# Patient Record
Sex: Male | Born: 1950 | Race: Black or African American | Hispanic: No | Marital: Single | Smoking: Current every day smoker
Health system: Southern US, Community
[De-identification: ages and names within clinical notes are randomized; demographics above are authoritative.]

## PROBLEM LIST (undated history)

## (undated) DIAGNOSIS — I639 Cerebral infarction, unspecified: Secondary | ICD-10-CM

## (undated) DIAGNOSIS — I6529 Occlusion and stenosis of unspecified carotid artery: Secondary | ICD-10-CM

## (undated) DIAGNOSIS — N189 Chronic kidney disease, unspecified: Secondary | ICD-10-CM

## (undated) DIAGNOSIS — F209 Schizophrenia, unspecified: Secondary | ICD-10-CM

## (undated) DIAGNOSIS — I10 Essential (primary) hypertension: Secondary | ICD-10-CM

## (undated) HISTORY — DX: Cerebral infarction, unspecified: I63.9

## (undated) HISTORY — DX: Occlusion and stenosis of unspecified carotid artery: I65.29

## (undated) HISTORY — DX: Chronic kidney disease, unspecified: N18.9

---

## 2001-04-21 ENCOUNTER — Emergency Department (HOSPITAL_COMMUNITY): Admission: EM | Admit: 2001-04-21 | Discharge: 2001-04-21 | Payer: Self-pay | Admitting: Emergency Medicine

## 2001-04-22 ENCOUNTER — Ambulatory Visit (HOSPITAL_COMMUNITY): Admission: RE | Admit: 2001-04-22 | Discharge: 2001-04-22 | Payer: Self-pay | Admitting: Internal Medicine

## 2006-03-22 ENCOUNTER — Observation Stay (HOSPITAL_COMMUNITY): Admission: EM | Admit: 2006-03-22 | Discharge: 2006-03-24 | Payer: Self-pay | Admitting: Emergency Medicine

## 2006-04-04 ENCOUNTER — Emergency Department (HOSPITAL_COMMUNITY): Admission: EM | Admit: 2006-04-04 | Discharge: 2006-04-04 | Payer: Self-pay | Admitting: Emergency Medicine

## 2008-01-27 ENCOUNTER — Observation Stay (HOSPITAL_COMMUNITY): Admission: EM | Admit: 2008-01-27 | Discharge: 2008-01-30 | Payer: Self-pay | Admitting: Emergency Medicine

## 2008-05-17 DIAGNOSIS — K921 Melena: Secondary | ICD-10-CM

## 2008-05-17 DIAGNOSIS — F1010 Alcohol abuse, uncomplicated: Secondary | ICD-10-CM | POA: Insufficient documentation

## 2008-05-17 DIAGNOSIS — N390 Urinary tract infection, site not specified: Secondary | ICD-10-CM

## 2008-05-18 ENCOUNTER — Ambulatory Visit: Payer: Self-pay | Admitting: Internal Medicine

## 2008-06-08 ENCOUNTER — Ambulatory Visit: Payer: Self-pay | Admitting: Internal Medicine

## 2008-06-08 ENCOUNTER — Ambulatory Visit (HOSPITAL_COMMUNITY): Admission: RE | Admit: 2008-06-08 | Discharge: 2008-06-08 | Payer: Self-pay | Admitting: Internal Medicine

## 2010-08-15 NOTE — Op Note (Signed)
NAMEERIC, HEWSON                 ACCOUNT NO.:  [id number]   MEDICAL RECORD NO.:  [id number]          PATIENT TYPE:  AMB   LOCATION:  DAY                           FACILITY:  [facility]   PHYSICIAN:  R. Garfield Cornea, M.D. DATE OF BIRTH:  Aug 18, 1950   DATE OF PROCEDURE:  06/08/2008  DATE OF DISCHARGE:                               OPERATIVE REPORT   PROCEDURE PERFORMED:  Diagnostic colonoscopy.   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  He now has  constipation.  No first-degree relatives with colon cancer.  One second-  degree relative with colon cancer, has not had any blood per rectum.  No  abdominal pain.  No upper GI tract symptoms.  Colonoscopy is now being  done in part as screening but in part as a diagnostic maneuver given the  change in bowel habits.  He certainly does not appear to have any  symptoms consistent with proctitis this time.  Colonoscopy now being  performed.  Risks, benefits and limitations have been reviewed,  questions answered.  Please see documentation in the medical record.   PROCEDURE NOTE:  O2 saturation, blood pressure, pulse, respiration were  monitored the entire procedure.   CONSCIOUS SEDATION:  Versed 3 mg IV, Demerol 75 mg IV in divided doses.   INSTRUMENT USED:  Pentax video chip system.   FINDINGS:  Digital rectal exam revealed no abnormalities.   ENDOSCOPIC FINDINGS:  The prep was adequate.  Colon:  Colonic mucosa was surveyed from the rectosigmoid junction  through the left, transverse and right colon to the area of appendiceal  orifice, ileocecal valve and cecum.  These structures well seen and  photographed for the record.  From this level scope was slowly and  cautiously withdrawn.  All previous mentioned mucosal surfaces were  again seen.  The colonic mucosa appeared entirely normal except some for  some focal areas of somewhat hyperpigmentation of the  rectosigmoid area.  There was no erosion, ulceration, polyp, evidence of colitis or neoplasm  seen in the colonic mucosa.  Scope was pulled down to the rectum.  Thorough examination of the rectal mucosa including retroflex view of  the anal verge and en face view of the anal canal demonstrated a single  external hemorrhoidal tag.  The rectal mucosa otherwise appeared normal.  The patient tolerated the procedure well, was reacted in endoscopy.  The  cecal withdrawal time 9 minutes.   IMPRESSION:  Single external hemorrhoidal tag, otherwise normal rectum.  Some focally hyperpigmented mucosa in the rectosigmoid of uncertain  significance.  Remainder of colonic mucosa appeared entirely normal.   RECOMMENDATIONS:  1. MiraLax 17 grams orally at bedtime nightly p.r.n. constipation.  2. Literature on constipation provided to Mr. Dillow.  3. Recommend repeat screening colonoscopy 10 years.      Bridgette Habermann, M.D.  Electronically Signed     RMR/MEDQ  D:  06/08/2008  T:  06/08/2008  Job:  [id number]   cc:   Percell [name] L. Luan Pulling, M.D.  Fax: [phone]

## 2010-08-15 NOTE — Discharge Summary (Signed)
NAMESHEY, Eddie [name]                 ACCOUNT NO.:  [id number]   MEDICAL RECORD NO.:  [id number]          PATIENT TYPE:  OBV   LOCATION:  A304                          FACILITY:  [facility]   PHYSICIAN:  Eddie [name], M.D.DATE OF BIRTH:  [birth year]   DATE OF ADMISSION:  01/27/2008  DATE OF DISCHARGE:                               DISCHARGE SUMMARY   Being discharged from observation.   FINAL DISCHARGE DIAGNOSES:  1. Multiple falls versus syncope.  2. Vitamin B12 deficiency.  3. Hypertension.  4. Schizo-affective disorder.  5. Gastroesophageal reflux disease.   Eddie [name] has had a sense that his left leg gets hot.  This sensation  moves up the left side of his body, then he falls.  He has had this on  several occasions.  He has not had any actual seizure activity.  It is  not clear that these have been witnessed.   PAST MEDICAL HISTORY:  Positive for hypertension, for which he has been  taking a diuretic, and for schizo-affective disorder.   MEDICATIONS:  1. Cogentin 2 mg daily.  2. Omeprazole 20 mg daily.  3. Hypercholesterolemia 25 mg daily.  4. Injectable antipsychotic for his schizo-affective disorder.   PHYSICAL EXAMINATION:  Temp 98.5, pulse 71, respirations 18, blood  pressure 103/73 with no orthostatic changes.  CNS:  No focal findings.   White blood count was 8900, hemoglobin 17.1.  He had platelet clumps.  He had an elevated D-dimer but no evidence of pulmonary emboli.   CT showed no acute intracranial findings.   Point-of-care cardiac markers were negative.   HOSPITAL COURSE:  He had orthostatic changes checked.  They were  negative.  He had a neurology consultation and did not have any focal  neurological findings, but he did have vitamin B12 deficiency.  That is  going to be treated.   DISCHARGE MEDICATIONS:  He is going to be discharged home to continue  with his previous medication:  1. Cogentin 2 mg daily.  2. Omeprazole 20 mg daily.  3.  Hydrochlorothiazide 25 mg daily.  4. His injectable.  5. He is also going to be on injectable vitamin B12 first daily.  6. Folic acid 1 mg daily.   He will follow up in my office for the injections.      Eddie [name], M.D.  Electronically Signed     ELH/MEDQ  D:  01/30/2008  T:  01/30/2008  Job:  [id number]

## 2010-08-15 NOTE — H&P (Signed)
Eddie [name], [name]                 ACCOUNT NO.:  [id number]   MEDICAL RECORD NO.:  [id number]          PATIENT TYPE:  AMB   LOCATION:  DAY                           FACILITY:  [facility]   PHYSICIAN:  Eddie [name], M.D. DATE OF BIRTH:  [birth year]   DATE OF ADMISSION:  DATE OF DISCHARGE:  LH                              HISTORY & PHYSICAL   CHIEF COMPLAINT:  Coming for colonoscopy, constipation.   HISTORY OF PRESENT ILLNESS:  Eddie [name] is a 60 year old, African  American gentleman with history of endoscopic/biopsy-proven proctitis,  family history of colon cancer in maternal grandmother who was told he  need to have a followup colonoscopy in 7 years.  His last colonoscopy  was in 2003, and he had marked inflammation of the rectum consistent  with proctitis.  Biopsy showed focal cryptitis and acute on chronic  inflammation.  He was treated with a short course of [place] at the time.  He also had a EGD at that time, which had a small hiatal hernia but  otherwise, unremarkable.  The patient tells me that he has had no  further rectal bleeding.  He has had some new onset constipation.  He is  only having a bowel movement once a week.  He denies any abdominal pain,  nausea, or vomiting.  His heartburn is well controlled on omeprazole.  He denies any dysphagia, odynophagia, or weight loss.   CURRENT MEDICATIONS:  1. Benztropine 2 mg daily.  2. HCTZ 25 mg daily.  3. Folic acid 1 mg daily.  4. Omeprazole 20 mg daily.  5. He receives some sort of shot at [facility] every 2 weeks for      schizophrenia.   ALLERGIES:  No known drug allergies.   PAST MEDICAL HISTORY:  1. Schizophrenia.  2. History of proctitis as above.   PAST SURGICAL HISTORY:  History of colonoscopy as above.   FAMILY HISTORY:  Mother has diabetes, glaucoma, and hypothyroidism.  Father deceased at age 36, unknown cause.  He has a brother who died  after a car fell on him at age 69.  He has multiple sisters with  diabetes.  Maternal grandmother reported to have colon cancer, age  unknown.   SOCIAL HISTORY:  He lives with his mother.  He has no children.  He is  single.  He is disabled.  He smokes a pack of cigarettes daily.  He  denies any alcohol use.   REVIEW OF SYSTEMS:  GI:  See HPI.  CONSTITUTIONAL:  See HPI.  CARDIOPULMONARY:  He denies chest pain, shortness of breath,  palpitations, or cough.  GENITOURINARY:  Denies dysuria, hematuria.   PHYSICAL EXAMINATION:  VITAL SIGNS:  Weight 201, height 6 feet, temp 98,  blood pressure 100/76, and pulse 64.  GENERAL:  Pleasant, well-nourished, well-developed, white gentleman in  no acute distress.  SKIN:  Warm and dry.  No jaundice.  HEENT:  Sclerae nonicteric.  Oropharyngeal mucosa moist and pink.  No  lesions, erythema, or exudate.  No lymphadenopathy or thyromegaly.  CHEST:  Lungs are clear to auscultation.  CARDIOVASCULAR:  Regular rate and rhythm.  No murmurs, rubs, or gallops.  ABDOMEN:  Positive bowel sounds.  Abdomen is soft, nontender, and  nondistended.  No organomegaly or masses.  No rebound or guarding.  No  abdominal bruits or hernias.  LOWER EXTREMITIES:  No edema.   IMPRESSION:  Eddie [name] is a 60 year old gentleman with history of  proctitis as outlined above.  He now has new onset constipation.  Family  history of colon cancer in a second-degree relative.  He was advised to  have surveillance colonoscopy in 7 years due to his family history, but  based on new guidelines, he can probably have a screening colonoscopy in  10 years.  However, he does have change in bowel movements with new-  onset constipation of several months' duration and would have asked  colonoscopy for diagnostic approach at this time.   PLAN:  1. Colonoscopy in the near future with Dr. Gala [name].  2. Trial of MiraLax 17 g daily, prescription for generic with 527 g, 5      refills provided.  3. Further recommendations to follow.      Eddie [name],  P.ABridgette [name], M.D.  Electronically Signed    LL/MEDQ  D:  05/18/2008  T:  05/18/2008  Job:  [id number]   cc:   Eddie [name] Eddie [name], M.D.  Fax: [phone]

## 2010-08-15 NOTE — Group Therapy Note (Signed)
NAMETIMBERLAND, ZEHRUNG                 ACCOUNT NO.:  [id number]   MEDICAL RECORD NO.:  [id number]          PATIENT TYPE:  OBV   LOCATION:  A304                          FACILITY:  [facility]   PHYSICIAN:  Edward L. Luan Pulling, M.D.DATE OF BIRTH:  06-Oct-1950                                 PROGRESS NOTE   SUBJECTIVE:  Mr. Eddie [name] has done well.  He is not having as much orthostatic change.  He says he does not feel like he is going to fall as much.  He is using  his TED hose and they seemed to have helped.  Dr. Freddie Apley help is  noted and appreciated.   OBJECTIVE:  GENERAL:  His exam this morning shows he is awake and alert.  He looks  comfortable.  CHEST:  Clear.  HEART:  Regular.   LABORATORY DATA:  His cortisol levels are normal.  TSH is normal.  Homocysteine is high.  Vitamin B12 is low so we may need to get him on some vitamin B12  injections and I am going to go ahead and start that today.   ASSESSMENT AND PLAN:  Otherwise, I think thinks are going well.  He can be discharged today.  Please see discharge summary for details.      Edward L. Luan Pulling, M.D.  Electronically Signed     ELH/MEDQ  D:  01/30/2008  T:  01/30/2008  Job:  [id number]

## 2010-08-15 NOTE — Group Therapy Note (Signed)
Eddie [name], Eddie [name]                 ACCOUNT NO.:  [id number]   MEDICAL RECORD NO.:  [id number]          PATIENT TYPE:  OBV   LOCATION:  A304                          FACILITY:  [facility]   PHYSICIAN:  Kofi A. Merlene Laughter, M.D. DATE OF BIRTH:  10-13-1950   DATE OF PROCEDURE:  01/29/2008  DATE OF DISCHARGE:                                 PROGRESS NOTE   Patient does not report having any dizzy spells or syncopal episode  today although in reviewing the orthostatics it appears that the nurse  recorded dizziness with one of the orthostatic measurements.  The first  one is laying down blood pressure 94/64, pulse 76.  Standing, 94/64,  pulse 89.  Second one laying down, blood pressure 115/69, pulse 72 and  standing 103/68 with pulse 84.  It is during the 2nd parameters where  the patient complained of dizziness and he did have some drop in the  systolic blood pressure.  Patient is currently awake and alert and  converses well.  He continued to have a mild dysarthria, moves all 4  extremities. Blood tests, vitamin B12 level of 173 which is low,  cortisol level 21, normal, TSH 1.39.  EEG pending.   ASSESSMENT:  1. Recurrent spells of syncope, unclear etiology.  EEG is pending.  We      will follow up that.  2. Vitamin B12 deficiency.  We will replace it with 1000 mcg while      hospitalized and switch to 1000 mcg weekly once discharged from the      hospital.  We will also add folic acid 1 mg daily.      Kofi A. Merlene Laughter, M.D.  Electronically Signed     KAD/MEDQ  D:  01/30/2008  T:  01/30/2008  Job:  [id number]

## 2010-08-15 NOTE — Consult Note (Signed)
Eddie [name], VOLKOV                 ACCOUNT NO.:  [id number]   MEDICAL RECORD NO.:  [id number]          PATIENT TYPE:  OBV   LOCATION:  A304                          FACILITY:  [facility]   PHYSICIAN:  Kofi A. Merlene Laughter, M.D. DATE OF BIRTH:  [birth year]   DATE OF CONSULTATION:  01/28/2008  DATE OF DISCHARGE:                                 CONSULTATION   REASON FOR CONSULTATION:  Fall versus syncope.   The patient is a 60 year old black male who apparently has had a couple  spells recently of passing out.  He has maybe had a few of these events  but they are a recent onset.  The patient reports that he gets a hot  sensation involving the left knee and left leg and then spreads to his  head and he simply blacks out.  Patient indicated he blacks out for  about 10 minutes.  There is no oral trauma.  No urinary continence.  No  focal neurological deficit.  He does report having paresthesias of the  hands bilaterally.  No headaches are reported.  No visual symptoms.   PAST MEDICAL HISTORY:  Significant for:  1. Schizoaffective disorder.  2. Hypertension.   ADMISSION MEDICATION:  1. Cogentin 2 mg daily.  2. Omeprazole 20 mg daily.  3. Hydrochlorothiazide 25 mg daily.   REVIEW OF SYSTEMS:  Essentially negative other than stated in history of  present illness.  It is also essentially unchanged from admission review  of systems   SOCIAL HISTORY:  Patient does smoke cigarettes.  No alcohol use.  No  illicit drug use.   PHYSICAL EXAMINATION:  Shows a mildly overweight pleasant man in no  acute distress.  Temperature 98.7.  pulse 71.  Respiration 18.  Blood  pressure 103/73.  HEENT EVALUATION:  Neck is supple.  Head is normocephalic, atraumatic.  ABDOMEN:  Soft.  EXTREMITIES:  No edema.  MENTATION:  The patient is awake and alert.  He converses fairly well.  He currently has reduced academic achievement at baseline.  He does seem  to have mild dysarthria.  I suspect this is probably baseline  for the  patient.  CRANIAL NERVE EVALUATION:  Pupils are 6 mL and reactive although  somewhat sluggish.  Extraocular movements are full.  Visual fields are  intact.  Facial muscles run symmetric.  Tongue is midline.  Uvula  midline.  Shoulder shrugs normal.  MOTOR EXAMINATION:  Shows normal tone, bulk, and strength throughout.  I  see no pronator drift.  Reflexes are preserved.  Coordination shows no  dysmetria.  No tremor.  No parkinsonism.  Sensation is symmetric to  light touch and pinprick.  Gait is slightly wobbly.   CT scan of the brain is reviewed and shows minimal white matter changes  but essentially negative CT scan.  WBC 8, hemoglobin is 70, platelet  count apparently the smear is clogged but the count appears to be  adequate.  Sodium 132, potassium 3.9, chloride 95, CO2 of  29, glucose  88, BUN 8, creatinine 1.0.  Liver enzymes normal.  Calcium is 9.5.  Urinalysis is negative.   ASSESSMENT:  Recurrent spells of what appears to be syncope of unclear  etiology.  Semiology does not seem to fit any known clinical syndromes  at this time.  Certainly, atypical seizure may be a possibility.  The  patient reported that he has had a cardiac evaluation in the past for  these events.  One wonders about medication effect possibly from the  Cogentin.  Certainly, orthostasis is a possibility.   RECOMMENDATION:  Agree with orthostatic parameters.  I am going to go  ahead and order an EEG.  We may want to consider reducing the dose of  the Cogentin to 1 mg.      Kofi A. Merlene Laughter, M.D.  Electronically Signed     KAD/MEDQ  D:  01/29/2008  T:  01/29/2008  Job:  [id number]

## 2010-08-15 NOTE — H&P (Signed)
[name], Eddie [name]                 ACCOUNT NO.:  [id number]   MEDICAL RECORD NO.:  [id number]          PATIENT TYPE:  OBV   LOCATION:  A304                          FACILITY:  [facility]   PHYSICIAN:  Edward L. Luan Pulling, M.D.DATE OF BIRTH:  24-Oct-1950   DATE OF ADMISSION:  01/27/2008  DATE OF DISCHARGE:  LH                              HISTORY & PHYSICAL   The patient admitted to observation for unwitnessed syncopal episode.   HISTORY:  Mr. Eddie [name] is a 60 year old who has had a 2-3 day history of  what he describes as passing out.  He says that his left leg gets hot,  it moves up the left side of his body, and then he falls.  He has not  had any seizures activity that has been noted.  It is not totally clear  to me that any of these have actually been witnessed.  He does not have  any headaches.  He does not have any fever.  He does not have any  chills.  He does not have any other new complaints.   PAST MEDICAL HISTORY:  Positive for:  1. Hypertension.  2. Schizoaffective disorder.   SOCIAL HISTORY:  He does not use alcohol.  Does not any illicit drugs.  He does smoke about a packet of cigarettes daily.   ALLERGIES:  He has had previous reactions to THORAZINE.   CURRENT MEDICATIONS:  As best I can tell are:  1. Cogentin 2 mg daily.  2. Omeprazole 20 mg daily.  3. Benztropine.  4. Hydrochlorothiazide 25 mg daily.  5. He takes what I think is some sort of an injection for his      schizoaffective disorder.   PHYSICAL EXAMINATION:  VITAL SIGNS:  His temperature is 98.5, pulse 71,  respirations 18, and blood pressure 103/73.  HEENT:  His pupils are reactive.  Nose and throat are clear.  NECK:  Supple without masses.  CHEST:  Clear.  HEART:  Regular.  No murmur, gallop, or rub.  ABDOMEN:  Soft.  EXTREMITIES:  Showed no edema.  CNS:  He has clearly got some of the problems, and he is on  antipsychotic medications, but he has no focal findings.  NEUROLOGIC:  He does not have any  focal neurological findings.  He has  good strength bilaterally.   LABORATORY DATA:  White count 8900, hemoglobin 17.1, and platelets show  clumps, but it does appear that the count is adequate.  D-dimer was  0.49.  Sodium 132.  CT angio of the chest, no evidence of pulmonary  embolus.  No acute findings in the chest.  CT of the brain, no acute  intracranial findings.  Chest x-ray showed no active disease.  Point-of-  care cardiac markers were negative.   ASSESSMENT:  He is having episodes of falling.  It is not at all clear  what this is from.  I am going to plan to get a Neurology consultation.  I am going to check orthostatic blood pressures, etc., and follow.      Edward L. Luan Pulling, M.D.  Electronically Signed  ELH/MEDQ  D:  01/28/2008  T:  01/28/2008  Job:  [id number]

## 2010-08-15 NOTE — Group Therapy Note (Signed)
NAMESUNSHINE, ZEMBA                 ACCOUNT NO.:  [id number]   MEDICAL RECORD NO.:  [id number]          PATIENT TYPE:  OBV   LOCATION:  A304                          FACILITY:  [facility]   PHYSICIAN:  Edward L. Luan Pulling, M.D.DATE OF BIRTH:  1950-06-24   DATE OF PROCEDURE:  DATE OF DISCHARGE:                                 PROGRESS NOTE   HISTORY:  Mr. Brissey seems better.  He has no new complaints.  He states  he still feels like he might fall.  His orthostatic changes are not  terribly significant, but he does seem to be a bit off balance when he  stands.  Dr. Merlene Laughter has seen him and his help is noted and  appreciated.   PHYSICAL EXAMINATION:  VITAL SIGNS:  Temperature is 98.5, pulse 67,  respirations 20, and blood pressure 120/74.  GENERAL:  He is awake and alert.  CHEST:  Pretty clear.  EXTREMITIES:  He does have some orthostatic symptoms at least.   ASSESSMENT:  He had some orthostasis.  Dr. Merlene Laughter has seen him and  suggested we do an EEG, which I think is appropriate.  I am going to try  to get some TED hose on him, but I am not sure there is going to be much  else we can do.  I am going to await and see what the results of the TED  hose are and then see if we can perhaps get him discharged as expected  in the morning.      Edward L. Luan Pulling, M.D.  Electronically Signed     ELH/MEDQ  D:  01/29/2008  T:  01/30/2008  Job:  [id number]

## 2010-08-18 NOTE — Op Note (Signed)
[facility]  Patient:    Eddie [name], Eddie [name] Visit Number: [id number] MRN: [id number]          Service Type: END Location: DAY Attending Physician:  Bridgette Habermann Dictated by:   Garfield Cornea, M.D. Proc. Date: 04/22/01 Admit Date:  04/22/2001 Discharge Date: 04/22/2001   CC:         Walden Field, M.D., ED, [facility]  Sinda Du, M.D.   Operative Report  PROCEDURE: 1. Diagnostic esophagogastroduodenoscopy. 2. Colonoscopy. 3. Biopsy. 4. Stool sampling.  GASTROENTEROLOGIST:  Garfield Cornea, M.D.  INDICATIONS:  The patient is a 60 year old gentleman who presented to the emergency department last evening with hematochezia.  He as seen by Dr. Ivy Lynn.  He was found to be hemodynamically stable with hemoglobin in the 16 range.  I was called to evaluate him.  He comes now for both EGD and colonoscopy.  He has not been using any NSAIDs, no melena.  He does not have any GI symptoms aside from chronic constipation and recent gross blood per rectum.  He tells me his grandmother had colon cancer.  EGD and colonoscopy are now being done to further evaluate his symptoms. This approach has been discussed with the patient at the bedside at length. Potential risks, benefits, and alternatives have been reviewed and questions answered.  The patient is agreeable.  Please see my handwritten H&P for more information.  PROCEDURE NOTE:  O2 saturation, blood pressure, and pulse for this patient were monitored throughout the entirety of both procedures.  CONSCIOUS SEDATION FOR BOTH PROCEDURES:   Demerol 75 mg IV,  Versed 3 mg IV in divided doses.  INSTRUMENT:  Olympus video chip gastroscope and colonoscope.  EGD FINDINGS:   Examination of the tubular esophagus revealed no mucosal abnormalities.   EG junction was easily traversed.  STOMACH:  Gastric cavity was emptied and insufflated well with air.  Thorough examination of gastric mucosa including retroflexed  view of the proximal stomach and esophagogastric junction demonstrated no abnormalities.  Pylorus was patent and easily traversed.  DUODENUM: Bulb and second portion appeared normal.  THERAPEUTIC/DIAGNOSTIC MANEUVERS PERFORMED:  None.  The patient was then prepared for colonoscopy.  COLONOSCOPY FINDINGS:  Digital rectal examination revealed no abnormalities.  ENDOSCOPIC FINDINGS:  Prep was good.  Rectum:  Examination of rectal mucosa including retroflexed view of the anal verge revealed grossly abnormal rectal mucosa with granularity, friability, erosions, loss of the normal vascular pattern in a patchy distribution from the anal verge to the rectosigmoid at 25 cm.  Please see photos.  There was little normal-appearing mucosa in patches in this inflamed rectum.  Retroflexion demonstrated no other abnormalities.  COLON:  Colonic mucosa was surveyed from the rectosigmoid junction through the left transverse right colon to the area of the appendiceal orifice, ileocecal valve, and cecum.  These structures were well seen and photographed.   Colonic mucosa appeared entirely normal to the cecum.  From the level of the cecum and ileocecal valve, the scope was slowly withdrawn.  All previously mentioned mucosal surfaces were again seen.  No other abnormalities were observed. Stool residue was aspirated for studies.  Also, biopsies of the rectum were taken for histology.  The patient tolerated both procedures well and was reacted in endoscopy.  IMPRESSION: 1. Esophagogastroduodenoscopy: Small hiatal hernia, otherwise normal upper    gastrointestinal tract. 2. Colonoscopy: Markedly inflamed rectum consistent with proctitis, etiology    uncertain at this time. 3. Normal-appearing colonic mucosa. 4. Stool studies pending. 5. Biopsy of  the rectal mucosa taken.  RECOMMENDATIONS: 1. The patient is chronically constipated.  I have asked him to start on    some Metamucil or Citrucel daily. 2.  I have given him a 10-day course of Anusol AC suppositories 1 p. r.    at bedtime. 3. Repeat CBC and sed rate today.  Follow up on stool studies and biopsies. 4. Further recommendations to follow. Dictated by:   Garfield Cornea, M.D. Attending Physician:  Bridgette Habermann DD:  04/22/01 TD:  04/23/01 Job: [id number]

## 2010-08-18 NOTE — Discharge Summary (Signed)
Eddie [name], Eddie [name]                 ACCOUNT NO.:  [id number]   MEDICAL RECORD NO.:  [id number]          PATIENT TYPE:  INP   LOCATION:  A220                          FACILITY:  [facility]   PHYSICIAN:  Audria Nine, M.D.DATE OF BIRTH:  27-Aug-1950   DATE OF ADMISSION:  03/22/2006  DATE OF DISCHARGE:  12/23/2007LH                               DISCHARGE SUMMARY   PRIMARY CARE PHYSICIAN:  Unassigned.   DISCHARGE DIAGNOSES:  1. Atypical chest pain.  2. Poorly controlled to uncontrolled hypertension.  3. History of psychiatric illness, likely schizoaffective disorder.   DISCHARGE MEDICATIONS:  1. Hydrochlorothiazide 25 p.o. once a day.  2. The patient is to resume all of his prior home medications as      previously taking.   FOLLOW UP:  The patient is to follow up with his primary care physician  as needed.   SPECIAL INSTRUCTIONS:  The patient was advised to return to the  emergency room if chest pain recurs.   The patient was informed that he has hypertension which we had some  trouble controlling during the course of hospitalization.  The patient  was advised to follow this up closely.   HOSPITAL COURSE:  Mr. Heinzman is a 60 year old African-American male with  a history of psychiatric illness.  The patient was noted to be a  generally poor historian on admission.  The patient was brought to the  emergency room by his mother.  The patient developed retrosternal chest  pain and epigastric pain 3 days prior to admission.  This developed  after eating a lot of ham.  The pain radiated to the jaw, left hand,  abdomen, and upper chest wall at 8/10 at its worst. There was concern  that this may represent a cardiac pain.  The patient was subsequently  admitted to a telemetry bed.  EKG was normal at the time of admission.  Cardiac enzymes returned negative.   There is premature coronary artery disease in his grandmother.  During  the course of hospitalization, the patient improved  dramatically without  any recurrence of his chest pain.   He did fairly well without any concerns.  The only abnormality found  during hospitalization was blood pressure elevation.  His blood pressure  ranged between 141-152/77-93.  It was recommended to start him on  hydrochlorothiazide as mentioned above.   Smoking cessation was also discussed in detail with him.   PERTINENT LABORATORY DATA:  On admission, chest x-ray showed no  pulmonary abnormalities, cardiomegaly, with minimal bronchitic changes.   A 12-lead EKG showed normal sinus rhythm with no acute ST-T changes.   White blood cell count 6.8, hemoglobin 15.3, hematocrit 46.1, platelet  count 271.  D-dimer was negative at 0.26.  Basic metabolic profile was  normal.  Cardiac enzymes were negative.  Cholesterol 167, triglycerides  206, HDL 21, LDL 105.  TSH was normal at 0.892. Urinalysis was negative.      Audria Nine, M.D.  Electronically Signed     AM/MEDQ  D:  03/26/2006  T:  03/26/2006  Job:  [id number]

## 2010-08-18 NOTE — H&P (Signed)
Eddie [name], Eddie [name]                 ACCOUNT NO.:  [id number]   MEDICAL RECORD NO.:  [id number]          PATIENT TYPE:  INP   LOCATION:  A220                          FACILITY:  [facility]   PHYSICIAN:  Eddie [name], Eddie MillerDATE OF BIRTH:  05/26/1950   DATE OF ADMISSION:  03/22/2006  DATE OF DISCHARGE:  12/23/2007LH                              HISTORY & PHYSICAL   PRIMARY CARE PHYSICIAN:  Previously Dr. Luan Pulling, but the patient is  unassigned, has not seen Dr. Luan Pulling in 2 years.   CHIEF COMPLAINT:  Chest pain.   HISTORY OF PRESENTING COMPLAINT:  Eddie [name] is a 60 year old African  American male with a history of psychiatric ailment.  He gets injection  every 2 weeks.  He is a poor historian.  However, he was accompanied to  the emergency room by his mother.  Mother stated the patient developed  epigastric/retrosternal chest pain about 3 days ago, after eating a lot  of ham.  According to the patient, this pain was radiating all over to  his jaw, left hand, abdomen, upper chest.  His history does not sound  reliable.  He denies associated shortness of breath or diaphoresis.  He  reported that this pain was 8/10 at its worst.  He described the pain as  burning.  It is currently 0/10.  The pain had resolved prior to  presentation at the emergency room.  There is no associated diarrhea,  vomiting, or nausea.   The patient had an EKG done in the emergency room which was a normal  sinus rhythm without acute ischemic changes.  His first set of cardiac  markers was negative.  The patient is a smoker and he smokes 2 packs of  cigarettes per day.  He has a history of premature coronary artery  disease in his maternal grandmother.  And given the fact that his  history is somewhat unreliable, he will be admitted for 24-hour  observation to rule out and further evaluate.   PAST MEDICAL HISTORY:  1. Psychiatric illness for which he gets injection every two weeks,      precise psychiatric  ailment is unclear.  2. Previous hospitalization at [facility] x2.   CURRENT MEDICATIONS:  1. Weekly injection, name unknown.  He obtains this from mental health      clinic.  2. Cogentin dose unknown.   FAMILY HISTORY:  Both parents are alive.  His mother has diabetes.  His  father's health history is unknown.  The patient's maternal grandmother  had MI at the age of 52.  His maternal family has a strong history of  coronary artery disease.  His paternal family has a strong history of  cancer.  Further details could not be obtained.   SOCIAL HISTORY:  The patient does not drink alcohol.  He is unemployed  secondary to mental issues.  He smokes cigarettes, two packs per day.   REVIEW OF SYSTEMS:  The patient denies shortness of breath, nausea,  vomiting, diarrhea, no dysuria, urgency, cough, or dyspnea on exertion.  The rest of the review of systems as  in the history of the presenting  complaint.   PHYSICAL EXAMINATION:  VITAL SIGNS:  Currently, temperature is 98.1,  blood pressure 156/87, pulse of 69, respiratory rate of 18, O2 sat of  98%.  GENERAL:  The patient is comfortable, alert, not in respiratory  distress.  HEENT:  Normocephalic atraumatic head.  Pupils are equal, round, and  reactive to light.  Extraocular muscle movement intact.  LUNGS:  Clear clinically to auscultation.  CV:  S1 S2 regular.  No murmur.  No gallop.  ABDOMEN:  Not distended.  Soft, nontender.  Bowel sounds present.  CHEST WALL:  No reproducible chest pain.  EXTREMITIES:  No pedal edema or calf tenderness.  Dorsalis pedis pulses  2+ bilaterally.  CNS:  No focal neurologic deficit.   INITIAL LABORATORY DATA:  White blood cells 6.7, hemoglobin 15.3,  hematocrit 46.1, platelets 271, differential is normal.  Sodium 139,  potassium 3.6, chloride 106, bicarb 28, glucose 120, BUN 7, creatinine  1, calcium 8.7.  Cardiac markers are the point-of-care myoglobin 42, CK-  MB normal.  Chest x-ray:   Cardiomegaly with minimal bronchitic changes.  EKG:  As mentioned in the HPI.   ASSESSMENT/PLAN:  1. Mr. Banaszewski is a 60 year old African American male presenting with      chest pain, precipitated by eating a large meal of ham.  His chest      pain is atypical likely gastrointestinal related.  The patient is      not a good historian, however, he has some known coronary risk      factors including smoking and family history of premature coronary      artery disease.  He will be admitted to rule out myocardial      infarction and risk stratify.  We will admit to telemetry, cycle      cardiac enzymes every 8 hours times 3, check a D-dimer, continue      aspirin 81 mg by mouth daily, Protonix 40 mg by mouth daily, use      Mylanta as-needed for indigestion, check a fasting lipid profile      and hemoglobin A1c.  2. Tobacco abuse.  We will give nicotine patch 24 mg daily and offer      tobacco cessation counseling.  3. Mental illness, unclear type.  We will continue Cogentin at 1 mg      daily.  The patient's last injection was 3 days ago.      Eddie [name], M.D.  Electronically Signed     MBB/MEDQ  D:  03/22/2006  T:  03/22/2006  Job:  [id number]

## 2011-01-01 LAB — POCT CARDIAC MARKERS
CKMB, poc: 1.3
Myoglobin, poc: 95.7
Troponin i, poc: <0.05

## 2011-01-01 LAB — COMPREHENSIVE METABOLIC PANEL
ALT: 10
AST: 21
CO2: 29
Calcium: 9.5
Chloride: 95 — ABNORMAL LOW
GFR calc Af Amer: >60
GFR calc non Af Amer: >60
Sodium: 132 — ABNORMAL LOW

## 2011-01-01 LAB — CBC
MCHC: 33.6
RBC: 5.65
WBC: 8.9

## 2011-01-01 LAB — DIFFERENTIAL
Basophils Absolute: 0.1
Basophils Relative: 1
Lymphs Abs: 3.2
Monocytes Relative: 6
Neutro Abs: 5

## 2011-01-01 LAB — D-DIMER, QUANTITATIVE: D-Dimer, Quant: 0.49 — ABNORMAL HIGH

## 2011-01-01 LAB — VITAMIN B12: Vitamin B-12: 173 — ABNORMAL LOW (ref 211–911)

## 2011-01-01 LAB — URINALYSIS, ROUTINE W REFLEX MICROSCOPIC
Nitrite: NEGATIVE
Specific Gravity, Urine: <1.005 — ABNORMAL LOW
Urobilinogen, UA: 1

## 2011-01-01 LAB — CORTISOL-AM, BLOOD: Cortisol - AM: 21

## 2011-10-17 ENCOUNTER — Emergency Department (HOSPITAL_COMMUNITY): Payer: Medicaid Other

## 2011-10-17 ENCOUNTER — Encounter (HOSPITAL_COMMUNITY): Payer: Self-pay

## 2011-10-17 ENCOUNTER — Emergency Department (HOSPITAL_COMMUNITY)
Admission: EM | Admit: 2011-10-17 | Discharge: 2011-10-17 | Disposition: A | Discharge status: Home / Self Care | Payer: Medicaid Other | Attending: Emergency Medicine | Admitting: Emergency Medicine

## 2011-10-17 DIAGNOSIS — S82899A Other fracture of unspecified lower leg, initial encounter for closed fracture: Secondary | ICD-10-CM | POA: Insufficient documentation

## 2011-10-17 DIAGNOSIS — W19XXXA Unspecified fall, initial encounter: Secondary | ICD-10-CM | POA: Insufficient documentation

## 2011-10-17 DIAGNOSIS — I10 Essential (primary) hypertension: Secondary | ICD-10-CM | POA: Insufficient documentation

## 2011-10-17 DIAGNOSIS — F209 Schizophrenia, unspecified: Secondary | ICD-10-CM | POA: Insufficient documentation

## 2011-10-17 DIAGNOSIS — Y92009 Unspecified place in unspecified non-institutional (private) residence as the place of occurrence of the external cause: Secondary | ICD-10-CM | POA: Insufficient documentation

## 2011-10-17 DIAGNOSIS — F17200 Nicotine dependence, unspecified, uncomplicated: Secondary | ICD-10-CM | POA: Insufficient documentation

## 2011-10-17 DIAGNOSIS — S82409A Unspecified fracture of shaft of unspecified fibula, initial encounter for closed fracture: Secondary | ICD-10-CM

## 2011-10-17 HISTORY — DX: Essential (primary) hypertension: I10

## 2011-10-17 HISTORY — DX: Schizophrenia, unspecified: F20.9

## 2011-10-17 MED ORDER — HYDROCODONE-ACETAMINOPHEN 5-325 MG PO TABS
2.0000 | ORAL_TABLET | Freq: Once | ORAL | Status: AC
  Administered 2011-10-17: 2 via ORAL
  Filled 2011-10-17: qty 2

## 2011-10-17 MED ORDER — ONDANSETRON HCL 4 MG PO TABS
4.0000 mg | ORAL_TABLET | Freq: Once | ORAL | Status: AC
  Administered 2011-10-17: 4 mg via ORAL
  Filled 2011-10-17: qty 1

## 2011-10-17 MED ORDER — HYDROCODONE-ACETAMINOPHEN 7.5-500 MG PO TABS: 1.0000 | ORAL_TABLET | ORAL | Status: AC | PRN

## 2011-10-17 NOTE — ED Notes (Signed)
Per ems, pt at home last night and fell from a standing position and landed on his left ankle.  Pt denies hitting his head or LOC.  Pt has some swelling to left ankle.

## 2011-10-17 NOTE — ED Provider Notes (Signed)
History     CSN: [id number]  Arrival date & time 10/17/11  0915   First MD Initiated Contact with Patient 10/17/11 [phone]      Chief Complaint  Patient presents with  . Ankle Pain    (Consider location/radiation/quality/duration/timing/severity/associated sxs/prior treatment) Patient is a 61 y.o. male presenting with ankle pain. The history is provided by the patient and the EMS personnel.  Ankle Pain  The incident occurred yesterday. The incident occurred at home. The injury mechanism was a fall. The pain is present in the left ankle. The quality of the pain is described as aching. The pain is moderate. The pain has been fluctuating since onset. Associated symptoms include inability to bear weight. Pertinent negatives include no numbness and no tingling. The symptoms are aggravated by bearing weight. He has tried nothing for the symptoms. The treatment provided no relief.    Past Medical History  Diagnosis Date  . Schizophrenia   . Hypertension     History reviewed. No pertinent past surgical history.  No family history on file.  History  Substance Use Topics  . Smoking status: Current Everyday Smoker  . Smokeless tobacco: Not on file  . Alcohol Use: No      Review of Systems  Constitutional: Negative for activity change.       All ROS Neg except as noted in HPI  HENT: Negative for nosebleeds and neck pain.   Eyes: Negative for photophobia and discharge.  Respiratory: Negative for cough, shortness of breath and wheezing.   Cardiovascular: Negative for chest pain and palpitations.  Gastrointestinal: Negative for abdominal pain and blood in stool.  Genitourinary: Negative for dysuria, frequency and hematuria.  Musculoskeletal: Negative for back pain and arthralgias.  Skin: Negative.   Neurological: Negative for dizziness, tingling, seizures, speech difficulty and numbness.  Psychiatric/Behavioral: Negative for hallucinations and confusion.    Allergies  Review of  patient's allergies indicates no known allergies.  Home Medications   Current Outpatient Rx  Name Route Sig Dispense Refill  . AMLODIPINE BESY-BENAZEPRIL HCL 5-20 MG PO CAPS Oral Take 1 capsule by mouth daily.    Marland Kitchen BENZTROPINE MESYLATE 2 MG PO TABS Oral Take 2 mg by mouth at bedtime.      BP 114/69  Pulse 82  Temp 98.5 F (36.9 C) (Oral)  Resp 18  Ht 6' (1.829 m)  Wt 198 lb (89.812 kg)  BMI 26.85 kg/m2  SpO2 97%  Physical Exam  Nursing note and vitals reviewed. Constitutional: He is oriented to person, place, and time. He appears well-developed and well-nourished.  Non-toxic appearance.  HENT:  Head: Normocephalic.  Right Ear: Tympanic membrane and external ear normal.  Left Ear: Tympanic membrane and external ear normal.  Eyes: EOM and lids are normal. Pupils are equal, round, and reactive to light.  Neck: Normal range of motion. Neck supple. Carotid bruit is not present.  Cardiovascular: Normal rate, regular rhythm, normal heart sounds, intact distal pulses and normal pulses.   Pulmonary/Chest: Breath sounds normal. No respiratory distress.  Abdominal: Soft. Bowel sounds are normal. There is no tenderness. There is no guarding.  Musculoskeletal: Normal range of motion.       There is full range of motion of the left hip and knee. There is pain to attempted range of motion of the left ankle. There is swelling and pain of the medial malleolus. The dorsalis pedis pulses are 2+. There is full range of motion of the toes.  Lymphadenopathy:  Head (right side): No submandibular adenopathy present.       Head (left side): No submandibular adenopathy present.    He has no cervical adenopathy.  Neurological: He is alert and oriented to person, place, and time. He has normal strength. No cranial nerve deficit or sensory deficit.  Skin: Skin is warm and dry.  Psychiatric: He has a normal mood and affect. His speech is normal.    ED Course  Procedures (including critical care  time)  Labs Reviewed - No data to display No results found.   No diagnosis found.    MDM  I have reviewed nursing notes, vital signs, and all appropriate lab and imaging results for this patient. Pt injured left ankle following a fall. Left ankle xray reveals nondisplaced distal fx of left fibula. Pt fitted with a posterior splint, ice and crutches.Pt referred to orthopedics. Rx for norco 7.5 given.       Lenox Ahr, [place] 10/20/11 1451

## 2011-10-22 ENCOUNTER — Encounter: Payer: Self-pay | Admitting: Orthopedic Surgery

## 2011-10-22 ENCOUNTER — Ambulatory Visit (INDEPENDENT_AMBULATORY_CARE_PROVIDER_SITE_OTHER): Payer: Medicaid Other | Admitting: Orthopedic Surgery

## 2011-10-22 VITALS — BP 124/60 | Ht 72.0 in | Wt 198.0 lb

## 2011-10-22 DIAGNOSIS — S8263XA Displaced fracture of lateral malleolus of unspecified fibula, initial encounter for closed fracture: Secondary | ICD-10-CM | POA: Insufficient documentation

## 2011-10-22 NOTE — ED Provider Notes (Signed)
Medical screening examination/treatment/procedure(s) were performed by non-physician practitioner and as supervising physician I was immediately available for consultation/collaboration.   Maudry Diego, MD 10/22/11 1446

## 2011-10-22 NOTE — Progress Notes (Signed)
  Subjective:    Patient ID: Eddie [name], male    DOB: 11/18/1950, 61 y.o.   MRN: [id number]  Ankle Injury  Incident onset: 7.16.2013. The incident occurred at home. The injury mechanism was a fall. The pain is present in the left ankle. The quality of the pain is described as aching (sharp). The pain is mild. The pain has been constant since onset. Pertinent negatives include no inability to bear weight, loss of motion, loss of sensation, muscle weakness, numbness or tingling.      Review of Systems  Neurological: Negative for tingling and numbness.   Other: instability, fainting, cough     Objective:   Physical Exam  Constitutional: He is oriented to person, place, and time. He appears well-developed and well-nourished.  HENT:  Head: Normocephalic.  Eyes: Pupils are equal, round, and reactive to light.  Cardiovascular: Normal rate and intact distal pulses.   Pulmonary/Chest: Effort normal.  Abdominal: He exhibits no distension.  Neurological: He is alert and oriented to person, place, and time. He has normal reflexes. No cranial nerve deficit. He exhibits normal muscle tone. Coordination normal.  Skin: Skin is warm and dry.  Psychiatric: He has a normal mood and affect. His behavior is normal.   Upper extremity exam  Inspection and palpation revealed no abnormalities in the upper extremities.  Range of motion is full without contracture.  Motor exam is normal with grade 5 strength.  The joints are fully reduced without subluxation.  There is no atrophy or tremor and muscle tone is normal.  All joints are stable.    Right Ankle Exam  Right ankle exam is normal. Swelling: none  Tenderness  The patient is experiencing no tenderness.  Range of Motion  The patient has normal right ankle ROM.  Muscle Strength  The patient has normal right ankle strength.  Tests  Anterior drawer: negative Other  Erythema: absent Scars: absent Sensation: normal Pulse: present     Left Ankle Exam  Swelling: moderate  Tenderness  The patient is experiencing tenderness in the ATF and lateral malleolus.   Range of Motion  Dorsiflexion: abnormal  Plantar flexion: normal  Inversion: normal  Eversion: normal   Muscle Strength  Dorsiflexion:  4/5  Plantar flexion:  5/5  Anterior tibial:  5/5  Posterior tibial:  5/5  Gastrosoleus:  5/5  Peroneal muscle:  5/5   Tests  Anterior drawer: negative Varus tilt: negative  Other  Erythema: absent Scars: absent Sensation: normal Pulse: present          Assessment & Plan:

## 2011-10-22 NOTE — Patient Instructions (Addendum)
Wear brace x 6 weeks

## 2011-12-06 ENCOUNTER — Encounter: Payer: Self-pay | Admitting: Orthopedic Surgery

## 2011-12-06 ENCOUNTER — Other Ambulatory Visit: Payer: Self-pay | Admitting: Orthopedic Surgery

## 2011-12-06 ENCOUNTER — Ambulatory Visit (INDEPENDENT_AMBULATORY_CARE_PROVIDER_SITE_OTHER): Payer: Medicaid Other | Admitting: Orthopedic Surgery

## 2011-12-06 ENCOUNTER — Ambulatory Visit (INDEPENDENT_AMBULATORY_CARE_PROVIDER_SITE_OTHER): Payer: Medicaid Other

## 2011-12-06 VITALS — BP 104/70 | Ht 72.0 in | Wt 198.0 lb

## 2011-12-06 DIAGNOSIS — R52 Pain, unspecified: Secondary | ICD-10-CM

## 2011-12-06 DIAGNOSIS — S8263XA Displaced fracture of lateral malleolus of unspecified fibula, initial encounter for closed fracture: Secondary | ICD-10-CM

## 2011-12-06 NOTE — Patient Instructions (Signed)
Remove boot gradually over the next 3 weeks

## 2011-12-06 NOTE — Progress Notes (Signed)
Patient ID: Eddie [name], male   DOB: 1951-01-14, 61 y.o.   MRN: [id number] Chief Complaint  Patient presents with  . Follow-up    6 week xray left ankle fracture    Lateral malleolus fracture treated with Cam Walker crutches weightbearing as tolerated  He is now 7 weeks and 3 days post injury  Exam the toe with crutches weightbearing as tolerated with the Cam Walker  His x-ray shows callus formation although the fracture line is still visible  He will self wean himself from the boot over the next 3-4 weeks  Follow up as needed

## 2015-02-09 ENCOUNTER — Telehealth: Payer: Self-pay

## 2015-02-09 ENCOUNTER — Encounter: Payer: Self-pay | Admitting: Vascular Surgery

## 2015-02-09 NOTE — Telephone Encounter (Signed)
rec'd phone call from Dr. 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 was admitted on 02/05/15 with stroke symptoms, and has been stable, and will be discharged today.  Requested a Vascular Surgery evaluation within the next 2 days.  Stated that she would order films to be transferred to a CD Rom, for pt. to bring to office appt.; stated pt. has had an MRI, and Carotid duplex studies.

## 2015-02-09 NOTE — Telephone Encounter (Signed)
Appt given to pts mother- she will make sure he brings the disk. Called [facility] dept- they will not release records unless they receive it on letterhead. I have faxed the request to [phone] at 4:16pm.

## 2015-02-10 ENCOUNTER — Other Ambulatory Visit (HOSPITAL_COMMUNITY): Payer: Self-pay | Admitting: Interventional Radiology

## 2015-02-10 ENCOUNTER — Encounter: Payer: Self-pay | Admitting: Vascular Surgery

## 2015-02-10 ENCOUNTER — Ambulatory Visit (INDEPENDENT_AMBULATORY_CARE_PROVIDER_SITE_OTHER): Payer: Medicaid Other | Admitting: Vascular Surgery

## 2015-02-10 VITALS — BP 156/127 | HR 85 | Ht 72.0 in | Wt 191.2 lb

## 2015-02-10 DIAGNOSIS — I771 Stricture of artery: Secondary | ICD-10-CM

## 2015-02-10 DIAGNOSIS — I63233 Cerebral infarction due to unspecified occlusion or stenosis of bilateral carotid arteries: Secondary | ICD-10-CM | POA: Diagnosis not present

## 2015-02-10 NOTE — Progress Notes (Signed)
Patient name: Eddie [name] MRN: [id number] DOB: 1950/07/30 Sex: male     Reason for referral:  Chief Complaint  Patient presents with  . New Evaluation    ref. by [facility]- Dr. 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 He is here today with his mother. He has a history of schizophrenia. He was admitted to [facility] last week with the altered mental status. 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 Workup included CT angiogram. I have reviewed his films and discussed these with the patient. This showed complete occlusion of both carotid and both vertebral vessels. He reports that he is back to his baseline mental status difficulties. He denies any focal motor deficits  Past Medical History  Diagnosis Date  . Schizophrenia ([place])   . Hypertension   . Stroke ([place])   . Carotid artery occlusion   . Chronic kidney disease     History reviewed. No pertinent past surgical history.  Social History   Social History  . Marital Status: Single    Spouse Name: N/A  . Number of Children: N/A  . Years of Education: 11   Occupational History  . Not on file.   Social History Main Topics  . Smoking status: Current Every Day Smoker -- 1.00 packs/day for 50 years    Types: Cigarettes  . Smokeless tobacco: Not on file  . Alcohol Use: No  . Drug Use: No  . Sexual Activity: Not on file   Other Topics Concern  . Not on file   Social History Narrative    Family History  Problem Relation Age of Onset  . Heart disease    . Diabetes    . Kidney disease      Allergies as of 02/10/2015  . (No Known Allergies)    Current Outpatient Prescriptions on File Prior to Visit  Medication Sig Dispense Refill  . amLODipine-benazepril (LOTREL) 5-20 MG per capsule Take 1 capsule by mouth daily.    . benztropine (COGENTIN) 2 MG tablet Take 2 mg by  mouth at bedtime.     No current facility-administered medications on file prior to visit.     REVIEW OF SYSTEMS:  Positives indicated with an "X"  CARDIOVASCULAR:  [ ]  chest pain   [ ]  chest pressure   [ ]  palpitations   [ ]  orthopnea   [ ]  dyspnea on exertion   [ ]  claudication   [ ]  rest pain   [ ]  DVT   [ ]  phlebitis PULMONARY:   [ ]  productive cough   [ ]  asthma   [ ]  wheezing NEUROLOGIC:   [ ]  weakness  [ ]  paresthesias  [ ]  aphasia  [ ]  amaurosis  [ ]  dizziness HEMATOLOGIC:   [ ]  bleeding problems   [ ]  clotting disorders MUSCULOSKELETAL:  [ ]  joint pain   [ ]  joint swelling GASTROINTESTINAL: [ ]   blood in stool  [ ]   hematemesis GENITOURINARY:  [ ]   dysuria  [ ]   hematuria PSYCHIATRIC:  [ ]  history of major depression INTEGUMENTARY:  [ ]  rashes  [ ]  ulcers CONSTITUTIONAL:  [ ]  fever   [ ]  chills  PHYSICAL EXAMINATION:  General: The patient is a well-nourished male, in no acute distress. Vital signs are BP 156/127 mmHg  Pulse 85  Ht 6' (1.829 m)  Wt 191  lb 3.2 oz (86.728 kg)  BMI 25.93 kg/m2  SpO2 99% Pulmonary: There is a good air exchange bilaterally without wheezing or rales. Abdomen: Soft and non-tender with normal pitch bowel sounds. No aneurysm palpable Musculoskeletal: There are no major deformities.  There is no significant extremity pain. Neurologic: No focal weakness or paresthesias are detected, Skin: There are no ulcer or rashes noted. Psychiatric: The patient has normal affect. Is able to answer questions. Cardiovascular: There is a regular rate and rhythm without significant murmur appreciated.  Carotid arteries without bruits bilaterally 2+ radial pulses bilaterally    Impression and Plan:  I discussed the patient's CT findings of occlusion of both internal carotid and both vertebral arteries with the patient. I explained that this is highly unusual. On imaging review he does appear to be have occlusion of this and reconstitution of his vertebral  arteries which appears to be the dominant flow into his brain. I discussed this by telephone with neuro interventional radiologist, Dr. Juliet Rude. The agree that diagnostic cerebral arteriogram for better visualization is in order. I discussed this with the patient and his mother. Also explained that be unlikely to find surgically correctable disease but we would await the arteriogram and might have options for endovascular treatment. The radiology department will contact Mr. Alteri to schedule outpatient diagnostics arteriogram with in the week    Espn [facility] [place] Office: [phone]

## 2015-02-21 ENCOUNTER — Other Ambulatory Visit: Payer: Self-pay | Admitting: Radiology

## 2015-02-22 ENCOUNTER — Ambulatory Visit (HOSPITAL_COMMUNITY)
Admission: RE | Admit: 2015-02-22 | Discharge: 2015-02-22 | Disposition: A | Discharge status: Home / Self Care | Payer: Medicaid Other | Source: Ambulatory Visit | Attending: Interventional Radiology | Admitting: Interventional Radiology

## 2015-02-22 ENCOUNTER — Encounter: Payer: Self-pay | Admitting: Diagnostic Radiology

## 2015-02-22 DIAGNOSIS — R7889 Finding of other specified substances, not normally found in blood: Secondary | ICD-10-CM | POA: Insufficient documentation

## 2015-02-22 DIAGNOSIS — Z5309 Procedure and treatment not carried out because of other contraindication: Secondary | ICD-10-CM | POA: Diagnosis present

## 2015-02-22 DIAGNOSIS — I771 Stricture of artery: Secondary | ICD-10-CM

## 2015-02-22 LAB — CBC WITH DIFFERENTIAL/PLATELET
Basophils Absolute: 0.1 10*3/uL (ref 0.0–0.1)
Basophils Relative: 1 %
EOS ABS: 0.2 10*3/uL (ref 0.0–0.7)
EOS PCT: 2 %
HCT: 45.2 % (ref 39.0–52.0)
Hemoglobin: 15.3 g/dL (ref 13.0–17.0)
LYMPHS ABS: 1.8 10*3/uL (ref 0.7–4.0)
LYMPHS PCT: 20 %
MCH: 31.1 pg (ref 26.0–34.0)
MCHC: 33.8 g/dL (ref 30.0–36.0)
MCV: 91.9 fL (ref 78.0–100.0)
MONO ABS: 0.5 10*3/uL (ref 0.1–1.0)
MONOS PCT: 6 %
Neutro Abs: 6.6 10*3/uL (ref 1.7–7.7)
Neutrophils Relative %: 71 %
PLATELETS: 237 10*3/uL (ref 150–400)
RBC: 4.92 MIL/uL (ref 4.22–5.81)
RDW: 14.9 % (ref 11.5–15.5)
WBC: 9.1 10*3/uL (ref 4.0–10.5)

## 2015-02-22 LAB — BASIC METABOLIC PANEL
Anion gap: 7 (ref 5–15)
BUN: 27 mg/dL — AB (ref 6–20)
CHLORIDE: 109 mmol/L (ref 101–111)
CO2: 23 mmol/L (ref 22–32)
CREATININE: 1.96 mg/dL — AB (ref 0.61–1.24)
Calcium: 8.9 mg/dL (ref 8.9–10.3)
GFR calc Af Amer: 40 mL/min — ABNORMAL LOW (ref >60)
GFR calc non Af Amer: 35 mL/min — ABNORMAL LOW (ref >60)
GLUCOSE: 113 mg/dL — AB (ref 65–99)
POTASSIUM: 4.4 mmol/L (ref 3.5–5.1)
SODIUM: 139 mmol/L (ref 135–145)

## 2015-02-22 LAB — PROTIME-INR
INR: 1.01 (ref 0.00–1.49)
PROTHROMBIN TIME: 13.5 s (ref 11.6–15.2)

## 2015-02-22 LAB — APTT: aPTT: 30 seconds (ref 24–37)

## 2015-02-22 MED ORDER — SODIUM CHLORIDE 0.9 % IV SOLN
INTRAVENOUS | Status: DC
  Administered 2015-02-22: 09:00:00 via INTRAVENOUS

## 2015-02-22 NOTE — Progress Notes (Signed)
Patient presented for diagnostic cerebral arteriogram today, labs revealed Cr of 1.96 last renal function was in 2009 Cr 1.02 The patient denies any known kidney disease and has never seen a nephrologist. He denies any history of diabetes and does not drink much water at home. I have spoke with Dr. Estanislado Pandy today and we will cancel today's procedure. The patient will be rescheduled after he was instructed to hydrate with plenty of water today. We will repeat labs day of procedure, the patient and his family were instructed today to increase water intake and 2 days prior to the procedure to double water intake, they verbalize understanding of the above plan. Our scheduler is to contact the patient.   Tsosie Billing PA-C Interventional Radiology  02/22/15  10:32 AM

## 2015-03-08 ENCOUNTER — Other Ambulatory Visit: Payer: Self-pay | Admitting: Radiology

## 2015-03-10 ENCOUNTER — Other Ambulatory Visit: Payer: Self-pay | Admitting: Radiology

## 2015-03-11 ENCOUNTER — Other Ambulatory Visit (HOSPITAL_COMMUNITY): Payer: Self-pay | Admitting: Interventional Radiology

## 2015-03-11 ENCOUNTER — Ambulatory Visit (HOSPITAL_COMMUNITY)
Admission: RE | Admit: 2015-03-11 | Discharge: 2015-03-11 | Disposition: A | Discharge status: Home / Self Care | Payer: Medicaid Other | Source: Ambulatory Visit | Attending: Interventional Radiology | Admitting: Interventional Radiology

## 2015-03-11 ENCOUNTER — Encounter (HOSPITAL_COMMUNITY): Payer: Self-pay

## 2015-03-11 DIAGNOSIS — N189 Chronic kidney disease, unspecified: Secondary | ICD-10-CM | POA: Insufficient documentation

## 2015-03-11 DIAGNOSIS — I129 Hypertensive chronic kidney disease with stage 1 through stage 4 chronic kidney disease, or unspecified chronic kidney disease: Secondary | ICD-10-CM | POA: Diagnosis not present

## 2015-03-11 DIAGNOSIS — Z7982 Long term (current) use of aspirin: Secondary | ICD-10-CM | POA: Diagnosis not present

## 2015-03-11 DIAGNOSIS — I6523 Occlusion and stenosis of bilateral carotid arteries: Secondary | ICD-10-CM | POA: Insufficient documentation

## 2015-03-11 DIAGNOSIS — F209 Schizophrenia, unspecified: Secondary | ICD-10-CM | POA: Diagnosis not present

## 2015-03-11 DIAGNOSIS — F17210 Nicotine dependence, cigarettes, uncomplicated: Secondary | ICD-10-CM | POA: Insufficient documentation

## 2015-03-11 DIAGNOSIS — Z8673 Personal history of transient ischemic attack (TIA), and cerebral infarction without residual deficits: Secondary | ICD-10-CM | POA: Diagnosis not present

## 2015-03-11 DIAGNOSIS — Z48812 Encounter for surgical aftercare following surgery on the circulatory system: Secondary | ICD-10-CM | POA: Diagnosis not present

## 2015-03-11 DIAGNOSIS — I771 Stricture of artery: Secondary | ICD-10-CM | POA: Insufficient documentation

## 2015-03-11 LAB — CBC
HEMATOCRIT: 45.3 % (ref 39.0–52.0)
HEMOGLOBIN: 15.3 g/dL (ref 13.0–17.0)
MCH: 30.8 pg (ref 26.0–34.0)
MCHC: 33.8 g/dL (ref 30.0–36.0)
MCV: 91.1 fL (ref 78.0–100.0)
Platelets: 179 10*3/uL (ref 150–400)
RBC: 4.97 MIL/uL (ref 4.22–5.81)
RDW: 14.6 % (ref 11.5–15.5)
WBC: 6.5 10*3/uL (ref 4.0–10.5)

## 2015-03-11 LAB — BASIC METABOLIC PANEL
ANION GAP: 7 (ref 5–15)
BUN: 17 mg/dL (ref 6–20)
CALCIUM: 8.9 mg/dL (ref 8.9–10.3)
CO2: 26 mmol/L (ref 22–32)
Chloride: 106 mmol/L (ref 101–111)
Creatinine, Ser: 1.67 mg/dL — ABNORMAL HIGH (ref 0.61–1.24)
GFR calc Af Amer: 49 mL/min — ABNORMAL LOW (ref >60)
GFR calc non Af Amer: 42 mL/min — ABNORMAL LOW (ref >60)
GLUCOSE: 109 mg/dL — AB (ref 65–99)
POTASSIUM: 4.2 mmol/L (ref 3.5–5.1)
Sodium: 139 mmol/L (ref 135–145)

## 2015-03-11 LAB — APTT: APTT: 30 s (ref 24–37)

## 2015-03-11 LAB — PROTIME-INR
INR: 1.01 (ref 0.00–1.49)
Prothrombin Time: 13.5 seconds (ref 11.6–15.2)

## 2015-03-11 MED ORDER — FENTANYL CITRATE (PF) 100 MCG/2ML IJ SOLN
INTRAMUSCULAR | Status: AC
  Filled 2015-03-11: qty 2

## 2015-03-11 MED ORDER — HEPARIN SODIUM (PORCINE) 1000 UNIT/ML IJ SOLN
INTRAMUSCULAR | Status: AC | PRN
  Administered 2015-03-11: 1000 [IU] via INTRAVENOUS

## 2015-03-11 MED ORDER — MIDAZOLAM HCL 2 MG/2ML IJ SOLN
INTRAMUSCULAR | Status: AC | PRN
  Administered 2015-03-11: 1 mg via INTRAVENOUS

## 2015-03-11 MED ORDER — MIDAZOLAM HCL 2 MG/2ML IJ SOLN
INTRAMUSCULAR | Status: AC
  Filled 2015-03-11: qty 2

## 2015-03-11 MED ORDER — SODIUM CHLORIDE 0.9 % IV SOLN: INTRAVENOUS | Status: AC

## 2015-03-11 MED ORDER — HEPARIN SODIUM (PORCINE) 1000 UNIT/ML IJ SOLN
INTRAMUSCULAR | Status: AC
  Filled 2015-03-11: qty 1

## 2015-03-11 MED ORDER — LIDOCAINE HCL 1 % IJ SOLN
INTRAMUSCULAR | Status: AC
  Filled 2015-03-11: qty 20

## 2015-03-11 MED ORDER — SODIUM CHLORIDE 0.9 % IV SOLN
INTRAVENOUS | Status: DC
  Administered 2015-03-11: 10:00:00 via INTRAVENOUS

## 2015-03-11 MED ORDER — SODIUM CHLORIDE 0.9 % IV SOLN
INTRAVENOUS | Status: AC | PRN
  Administered 2015-03-11: 75 mL/h via INTRAVENOUS

## 2015-03-11 MED ORDER — AMLODIPINE BESYLATE 5 MG PO TABS
5.0000 mg | ORAL_TABLET | ORAL | Status: AC
  Administered 2015-03-11: 5 mg via ORAL

## 2015-03-11 MED ORDER — SODIUM CHLORIDE 0.9 % IV SOLN
Freq: Once | INTRAVENOUS | Status: AC
  Administered 2015-03-11: 09:00:00 via INTRAVENOUS

## 2015-03-11 MED ORDER — FENTANYL CITRATE (PF) 100 MCG/2ML IJ SOLN
INTRAMUSCULAR | Status: AC | PRN
  Administered 2015-03-11: 25 ug via INTRAVENOUS

## 2015-03-11 MED ORDER — AMLODIPINE BESYLATE 5 MG PO TABS
ORAL_TABLET | ORAL | Status: AC
  Administered 2015-03-11: 5 mg via ORAL
  Filled 2015-03-11: qty 1

## 2015-03-11 MED ORDER — IOHEXOL 300 MG/ML  SOLN
150.0000 mL | Freq: Once | INTRAMUSCULAR | Status: AC | PRN
  Administered 2015-03-11: 120 mL via INTRAVENOUS

## 2015-03-11 NOTE — Sedation Documentation (Signed)
Patient is resting comfortably. 

## 2015-03-11 NOTE — H&P (Signed)
Chief Complaint: Patient was seen in consultation today for  Cerebral arteriogram at the request of Dr Early  Referring Physician(s) Dr Sherren Mocha Early  History of Present Illness: Eddie [name] is a 64 y.o. male   Pt with onset Altered mental status and hospitalized at [facility] 02/2015 Determined to have suffered CVA Referred to Dr Early for vascular evaluation CTA shows B carotid occlusion and B Vertebral artery occlusion with reconstitution of vertebral arteries per his note Referred to Dr Estanislado Pandy for complete eval with cerebral arteriogram and treatment options Was scheduled for same 02/22/15 Creatinine was 1.96 that day---procedure cancelled Pt has hydrated at home as much as possible Cr today 1.67 Discussed with Dr Estanislado Pandy Plan is to run NS 250 cc bolus and 75 cc/hr; move ahead today   Past Medical History  Diagnosis Date  . Schizophrenia ([place])   . Hypertension   . Stroke ([place])   . Carotid artery occlusion   . Chronic kidney disease     History reviewed. No pertinent past surgical history.  Allergies: Review of patient's allergies indicates no known allergies.  Medications: Prior to Admission medications   Medication Sig Start Date End Date Taking? Authorizing Provider  amLODipine (NORVASC) 5 MG tablet Take 5 mg by mouth daily.   Yes Historical Provider, MD  aspirin 81 MG tablet Take 81 mg by mouth daily.   Yes Historical Provider, MD  atorvastatin (LIPITOR) 40 MG tablet Take 40 mg by mouth daily.   Yes Historical Provider, MD     Family History  Problem Relation Age of Onset  . Heart disease    . Diabetes    . Kidney disease      Social History   Social History  . Marital Status: Single    Spouse Name: N/A  . Number of Children: N/A  . Years of Education: 68   Social History Main Topics  . Smoking status: Current Every Day Smoker -- 1.00 packs/day for 50 years    Types: Cigarettes  . Smokeless tobacco: None  . Alcohol Use: No  . Drug  Use: No  . Sexual Activity: Not Asked   Other Topics Concern  . None   Social History Narrative     Review of Systems: A 12 point ROS discussed and pertinent positives are indicated in the HPI above.  All other systems are negative.  Review of Systems  Constitutional: Negative for fever, activity change, appetite change and fatigue.  HENT: Negative for tinnitus and trouble swallowing.   Eyes: Negative for visual disturbance.  Respiratory: Negative for cough and shortness of breath.   Gastrointestinal: Negative for nausea and abdominal pain.  Neurological: Negative for dizziness, tremors, seizures, syncope, facial asymmetry, speech difficulty, weakness, light-headedness, numbness and headaches.  Psychiatric/Behavioral: Negative for behavioral problems and confusion.    Vital Signs: BP 133/82 mmHg  Pulse 71  Temp(Src) 97.5 F (36.4 C)  Resp 18  Ht 6' (1.829 m)  Wt 180 lb (81.647 kg)  BMI 24.41 kg/m2  SpO2 100%  Physical Exam  Constitutional: He is oriented to person, place, and time.  Cardiovascular: Normal rate, regular rhythm and normal heart sounds.   Pulmonary/Chest: Effort normal and breath sounds normal. He has no wheezes.  Abdominal: Soft. Bowel sounds are normal.  Musculoskeletal: Normal range of motion.  Neurological: He is alert and oriented to person, place, and time.  Skin: Skin is warm and dry.  Psychiatric: He has a normal mood and affect. His behavior is  normal. Judgment and thought content normal.  Nursing note and vitals reviewed.   Mallampati Score:  MD Evaluation Airway: WNL Heart: WNL Abdomen: WNL Chest/ Lungs: WNL ASA  Classification: 3 Mallampati/Airway Score: One  Imaging: No results found.  Labs:  CBC:  Recent Labs  02/22/15 0924 03/11/15 0915  WBC 9.1 6.5  HGB 15.3 15.3  HCT 45.2 45.3  PLT 237 179    COAGS:  Recent Labs  02/22/15 0924  INR 1.01  APTT 30    BMP:  Recent Labs  02/22/15 0924 03/11/15 0915  NA  139 139  K 4.4 4.2  CL 109 106  CO2 23 26  GLUCOSE 113* 109*  BUN 27* 17  CALCIUM 8.9 8.9  CREATININE 1.96* 1.67*  GFRNONAA 35* 42*  GFRAA 40* 49*    LIVER FUNCTION TESTS: No results for input(s): BILITOT, AST, ALT, ALKPHOS, PROT, ALBUMIN in the last 8760 hours.  TUMOR MARKERS: No results for input(s): AFPTM, CEA, CA199, CHROMGRNA in the last 8760 hours.  Assessment and Plan:  Pt with AMS 02/2015---now resolved Found to have suffered CVA during [facility] work up CTA shows B Carotid and vertebral artery occlusions Referred for cerebral arteriogram to evaluate and determine possible treatment Risks and Benefits discussed with the patient including, but not limited to bleeding, infection, vascular injury, contrast induced renal failure, stroke or even death. All of the patient's questions were answered, patient is agreeable to proceed. Consent signed and in chart.  Thank you for this interesting consult.  I greatly enjoyed meeting Eddie [name] and look forward to participating in their care.  A copy of this report was sent to the requesting provider on this date.  Signed: Leeanna Slaby A 03/11/2015, 10:21 AM   I spent a total of  30 Minutes   in face to face in clinical consultation, greater than 50% of which was counseling/coordinating care for cerebral arteriogram

## 2015-03-11 NOTE — Discharge Instructions (Signed)
Angiogram, Care After °Refer to this sheet in the next few weeks. These instructions provide you with information about caring for yourself after your procedure. Your health care provider may also give you more specific instructions. Your treatment has been planned according to current medical practices, but problems sometimes occur. Call your health care provider if you have any problems or questions after your procedure. °WHAT TO EXPECT AFTER THE PROCEDURE °After your procedure, it is typical to have the following: °· Bruising at the catheter insertion site that usually fades within 1-2 weeks. °· Blood collecting in the tissue (hematoma) that may be painful to the touch. It should usually decrease in size and tenderness within 1-2 weeks. °HOME CARE INSTRUCTIONS °· Take medicines only as directed by your health care provider. °· You may shower 24-48 hours after the procedure or as directed by your health care provider. Remove the bandage (dressing) and gently wash the site with plain soap and water. Pat the area dry with a clean towel. Do not rub the site, because this may cause bleeding. °· Do not take baths, swim, or use a hot tub until your health care provider approves. °· Check your insertion site every day for redness, swelling, or drainage. °· Do not apply powder or lotion to the site. °· Do not lift over 10 lb (4.5 kg) for 5 days after your procedure or as directed by your health care provider. °· Ask your health care provider when it is okay to: °¨ Return to work or school. °¨ Resume usual physical activities or sports. °¨ Resume sexual activity. °· Do not drive home if you are discharged the same day as the procedure. Have someone else drive you. °· You may drive 24 hours after the procedure unless otherwise instructed by your health care provider. °· Do not operate machinery or power tools for 24 hours after the procedure or as directed by your health care provider. °· If your procedure was done as an  outpatient procedure, which means that you went home the same day as your procedure, a responsible adult should be with you for the first 24 hours after you arrive home. °· Keep all follow-up visits as directed by your health care provider. This is important. °SEEK MEDICAL CARE IF: °· You have a fever. °· You have chills. °· You have increased bleeding from the catheter insertion site. Hold pressure on the site.  CALL 911 °SEEK IMMEDIATE MEDICAL CARE IF: °· You have unusual pain at the catheter insertion site. °· You have redness, warmth, or swelling at the catheter insertion site. °· You have drainage (other than a small amount of blood on the dressing) from the catheter insertion site. °· The catheter insertion site is bleeding, and the bleeding does not stop after 30 minutes of holding steady pressure on the site. °· The area near or just beyond the catheter insertion site becomes pale, cool, tingly, or numb. °  °This information is not intended to replace advice given to you by your health care provider. Make sure you discuss any questions you have with your health care provider. °  °Document Released: 10/05/2004 Document Revised: 04/09/2014 Document Reviewed: 08/20/2012 °Elsevier Interactive Patient Education ©2016 [employer] Inc[employer] ° °

## 2015-03-11 NOTE — Sedation Documentation (Signed)
5Fr. Exoseal applied to right groin 

## 2015-03-11 NOTE — Progress Notes (Signed)
Blood pressure high since in PSS, Jannifer Franklin PA was called and orders followed.

## 2015-03-11 NOTE — Procedures (Signed)
S/P 4 vessel cerebral arteriogram. RT CFA approach. Findings. 1.Occluded both ICAs and both Vert arteries at their origins with distal reconstitutions from [facility] and and ascending cervical branches of thyrocervical trunks

## 2015-03-11 NOTE — Sedation Documentation (Signed)
Rudolph removed per Dr. Estanislado Pandy.

## 2016-08-22 IMAGING — XA IR ANGIO INTRA EXTRACRAN SEL COM CAROTID INNOMINATE BILAT MOD SE
1 series · 12 of 24 positions shown · IV contrast (IODINE)
Comparison: none

CLINICAL DATA: Recent left cerebral hemisphere ischemic stroke.
Abnormal CT angiogram of the head and neck.

[Series 300: dr. (person_name) · 12 of 179 slices shown]
[im 8/179]
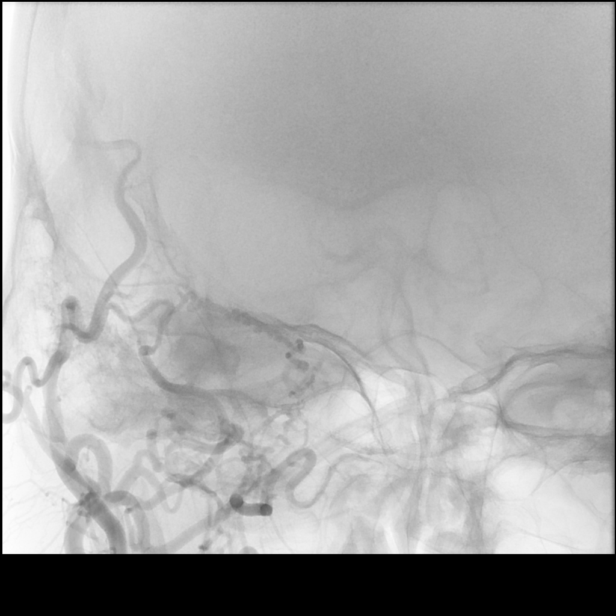
[im 24/179]
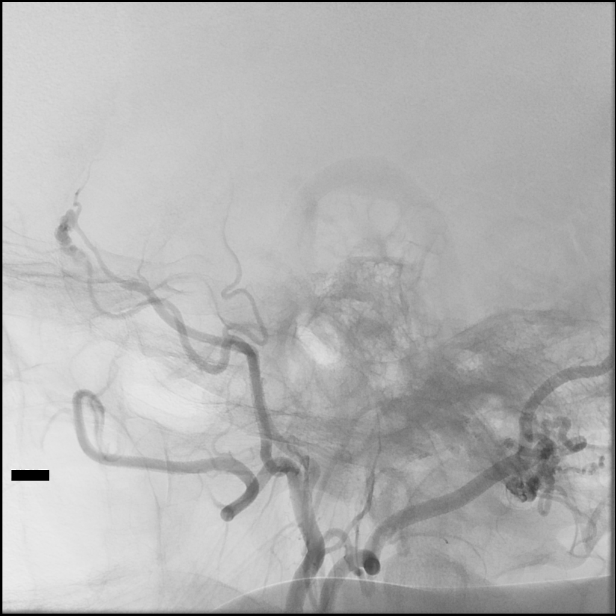
[im 39/179]
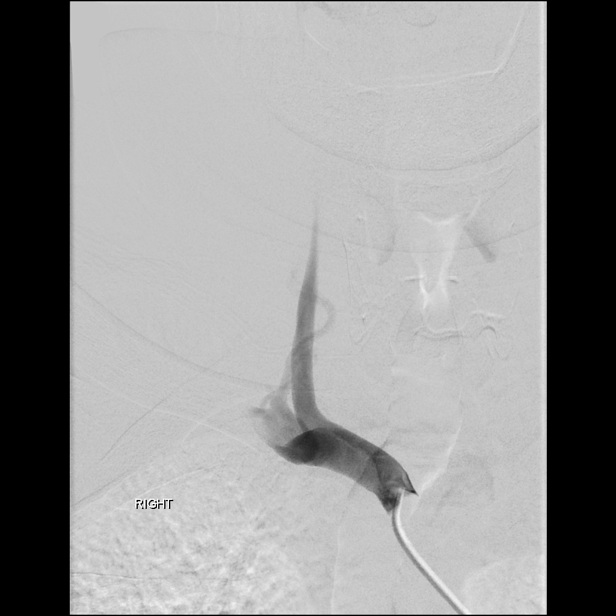
[im 55/179]
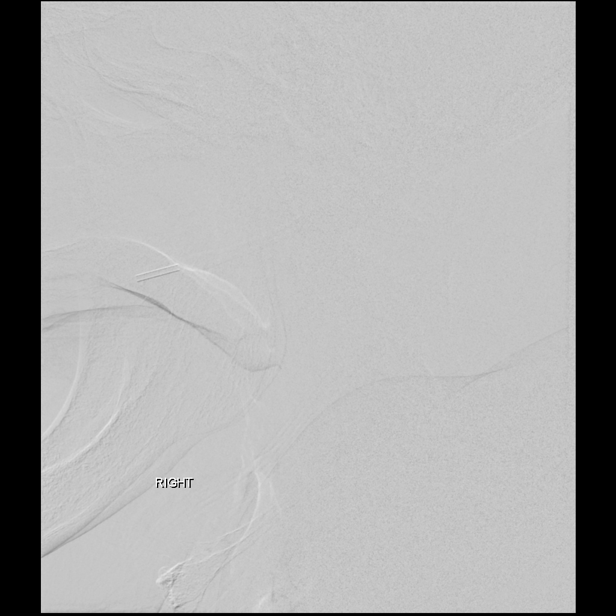
[im 70/179]
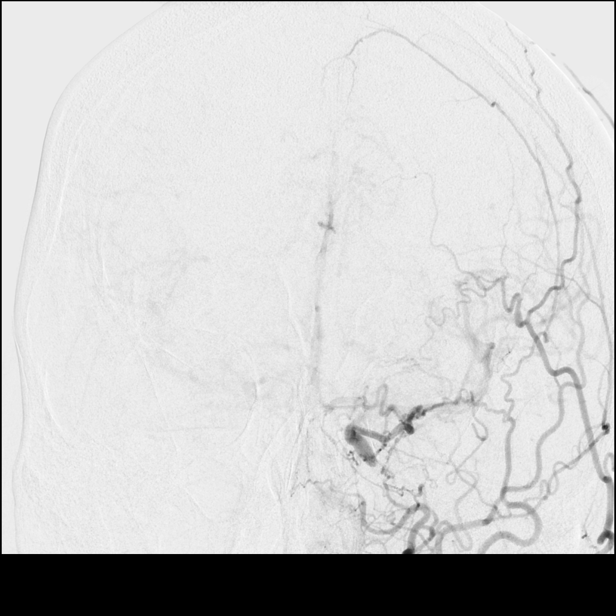
[im 86/179]
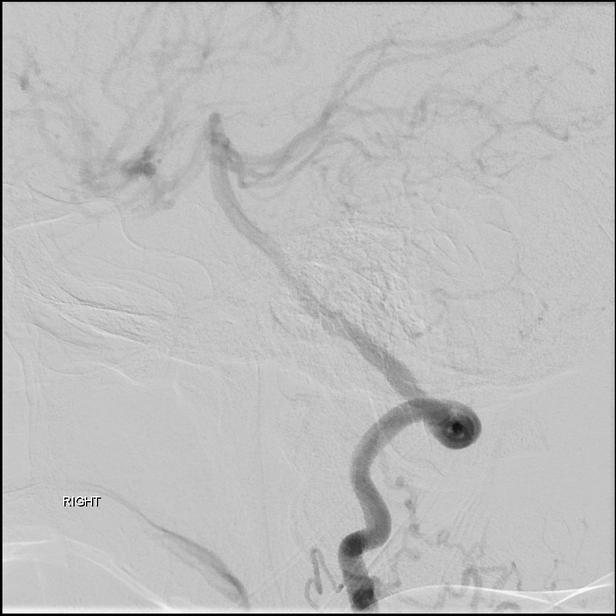
[im 101/179]
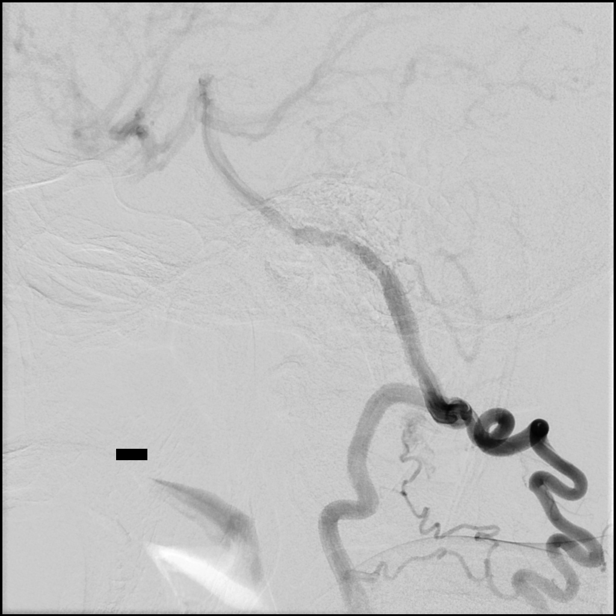
[im 117/179]
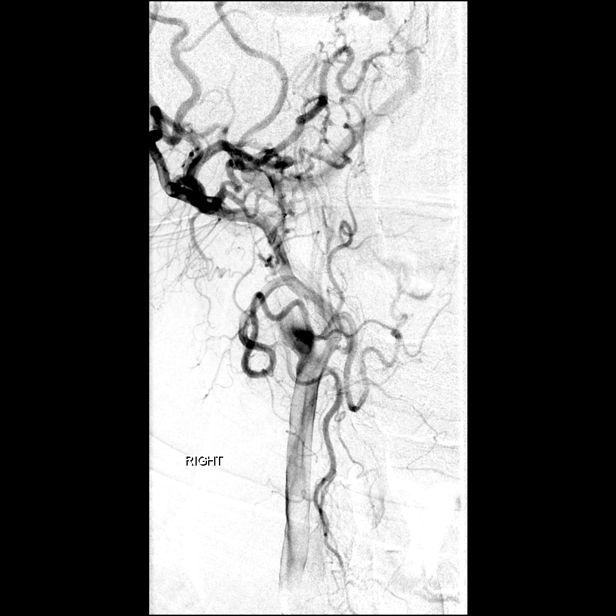
[im 132/179]
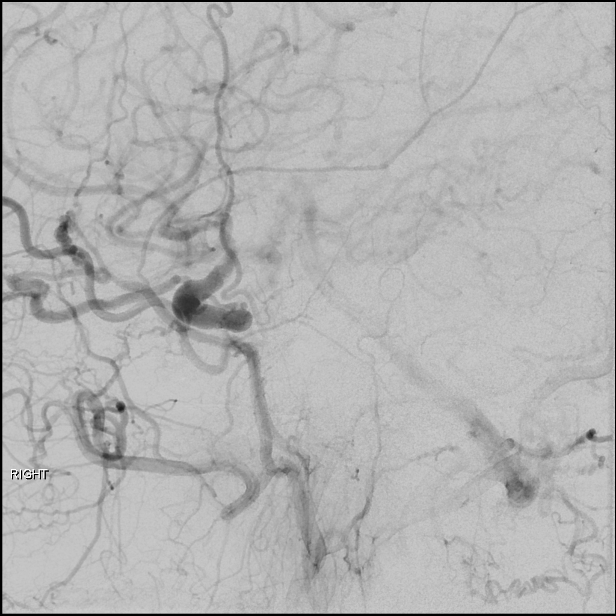
[im 148/179]
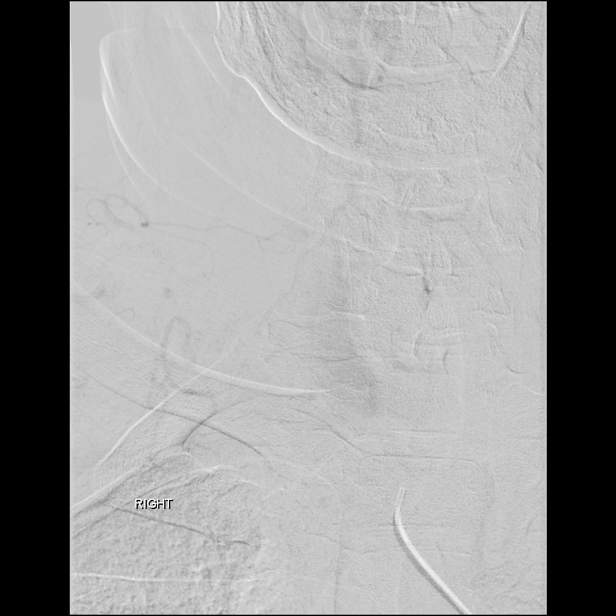
[im 163/179]
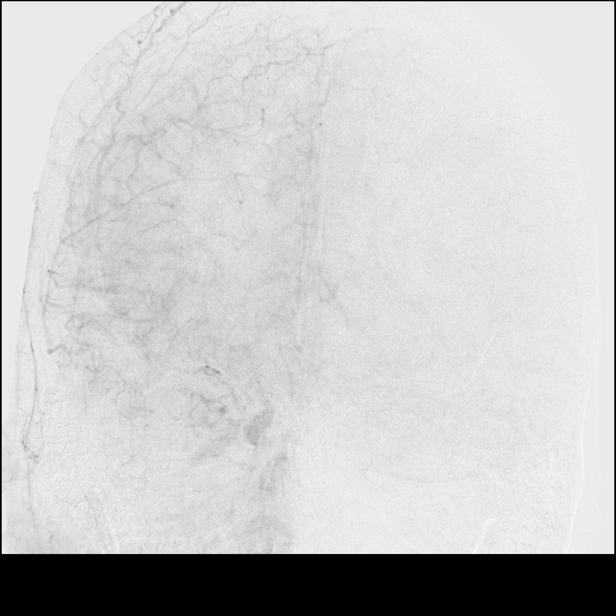
[im 179/179]
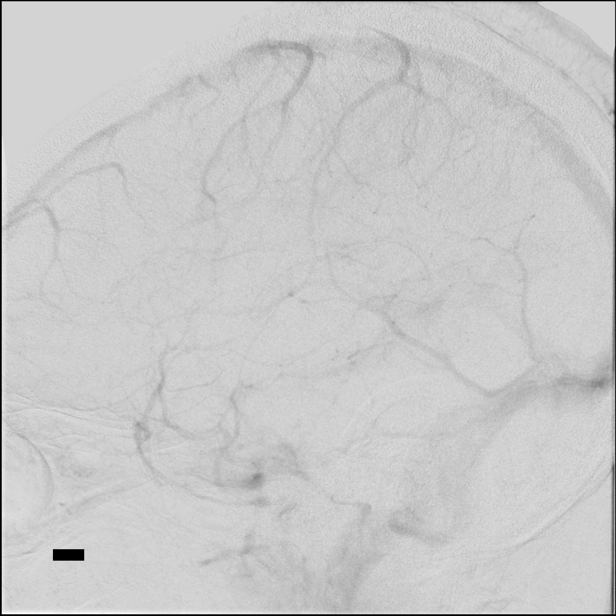

[12 of 24 positions shown; findings below may reference images not displayed]

EXAM:
FOUR VESSEL CEREBRAL ARTERIOGRAM:

ANESTHESIA/SEDATION:
Conscious sedation.

MEDICATIONS:
Versed 1 mg IV.  Fentanyl 25 mcg IV.

CONTRAST:  60 mL OMNIPAQUE IOHEXOL 300 MG/ML  SOLN

PROCEDURE:
Following a full explanation of the procedure along with the
potential associated complications, an informed witnessed consent
was obtained.

The right groin was prepped and draped in the usual sterile fashion.
Thereafter using modified Seldinger technique, transfemoral access
into the right common femoral artery was obtained without
difficulty. Over a 0.035 inch guidewire, a 5 French Pinnacle sheath
was inserted. Through this, and also over a 0.035 inch guidewire a 5
French JB1 catheter was advanced to the aortic arch region and
selectively positioned in the right common carotid artery, the right
subclavian artery, the right ascending cervical branch of the
thyrocervical trunk, the left ascending cervical branch of the
thyrocervical trunk, and the left common carotid artery.

COMPLICATIONS:
None immediate.
FINDINGS: The right common carotid arteriogram demonstrates a flame shaped
stump at the origin of the right internal carotid artery. The right
external carotid artery and its major branches are widely patent.

More distally there is reconstitution of the cavernous and the
supraclinoid segment of the right internal carotid artery from the
ipsilateral ophthalmic artery from the nasolacrimal branches.

Distal to this, the right middle and right anterior cerebral artery
is seen to opacify into the capillary and the venous phases.

Also noted is prompt opacification of the distal right vertebral
artery at the level of C1-C2 from posterior branches of the
posterior division of the ascending pharyngeal artery, and the
occipital artery.

The basilar artery appears of adequate caliber with flow noted into
the posterior cerebral arteries.

The right subclavian arteriogram demonstrates the stump of an
occluded right vertebral artery.

The right ascending cervical branch of the thyrocervical trunk is
abnormally prominent reconstituting the right vertebral artery from
the level of C5-C6 on rostrally.

More distally, there is normal opacification of the right posterior
inferior cerebellar artery and the right vertebrobasilar junction.

The basilar artery, the posterior cerebral arteries, superior
cerebellar arteries and the visualized anterior-inferior cerebellar
arteries are seen to opacify into the capillary and the venous
phases.

There is prompt retrograde opacification of the right and left
posterior communicating arteries leading to the internal carotid
arteries bilaterally and subsequently the middle cerebral artery
distributions and the anterior cerebral artery distributions.

The left common carotid arteriogram demonstrates the left external
carotid artery and its major branches to be widely patent.

The left internal carotid artery is completely occluded at its
origin associated with a stump.

More distally there is retrograde opacification of the left internal
carotid artery cavernous and supraclinoid segments from the
ipsilateral ophthalmic artery and the nasolacrimal branches.

Non-opacified blood is seen in the supraclinoid from the ipsilateral
posterior communicating artery as described above.

Also noted is partial reconstitution of the left vertebral artery at
the level of C1-C2 from numerous branches arising from the occipital
artery, and also from the posterior branches of the ascending
pharyngeal branch. Antegrade flow is noted into the left middle
cerebral artery and the left anterior cerebral artery. The left
subclavian arteriogram demonstrates nonvisualization of the left
vertebral artery.

Abnormally prominent ascending cervical branch of the thyrocervical
trunk is noted with distal reconstitution of the left vertebral
artery at the level of C2. Cranially there is opacification of left
vertebrobasilar junction and left posterior-inferior cerebellar
artery. The basilar artery, the posterior cerebral arteries,
superior cerebellar arteries and the anterior-inferior cerebellar
arteries are seen to opacify into the capillary and the venous
phases. Non-opacified blood is seen in the basilar artery from the
contralateral vertebral artery.

Again noted is the prompt retrograde opacification via the posterior
communicating arteries of both middle cerebral arteries and anterior
cerebral arteries via the internal carotid artery termini.

Also seen is retrograde opacification of the left vertebral artery
with retrograde flow terminating at the level of C5-C6. No antegrade
clearance of contrast is seen from the distal vertebral artery.
IMPRESSION: Angiographically occluded right internal carotid artery with distal
reconstitution from the ipsilateral ophthalmic artery via the nasal
lacrimal arteries of the cavernous and supraclinoid segments.

Angiographically occluded right vertebral artery with distal
reconstitution from multiple branches from the ascending cervical
branch of the thyrocervical trunk as described above.

Angiographically occluded left internal carotid artery at the bulb
with distal reconstitution of the left internal carotid artery
cavernous and supraclinoid segments from the ipsilateral ophthalmic
artery via the nasolacrimal branches.

Occluded left vertebral artery proximally, with distal
reconstitution of the left vertebral artery at the level of C1-C2
from the posterior branches of the ascending pharyngeal branches of
the left external carotid artery, and from the ascending cervical
branches of the thyrocervical trunk.

## 2017-12-04 ENCOUNTER — Other Ambulatory Visit (HOSPITAL_COMMUNITY): Payer: Self-pay | Admitting: Pulmonary Disease

## 2017-12-04 DIAGNOSIS — I639 Cerebral infarction, unspecified: Secondary | ICD-10-CM

## 2017-12-17 ENCOUNTER — Ambulatory Visit (HOSPITAL_COMMUNITY)
Admission: RE | Admit: 2017-12-17 | Discharge: 2017-12-17 | Disposition: A | Discharge status: Home / Self Care | Payer: Medicare Other | Source: Ambulatory Visit | Attending: Pulmonary Disease | Admitting: Pulmonary Disease

## 2017-12-17 DIAGNOSIS — I639 Cerebral infarction, unspecified: Secondary | ICD-10-CM | POA: Diagnosis present

## 2018-05-08 ENCOUNTER — Encounter: Payer: Self-pay | Admitting: Internal Medicine

## 2019-03-12 ENCOUNTER — Other Ambulatory Visit: Payer: Self-pay

## 2019-03-12 DIAGNOSIS — Z20822 Contact with and (suspected) exposure to covid-19: Secondary | ICD-10-CM

## 2019-03-13 LAB — NOVEL CORONAVIRUS, NAA: SARS-CoV-2, NAA: NOT DETECTED

## 2019-06-13 ENCOUNTER — Ambulatory Visit: Payer: Medicaid Other

## 2019-06-25 ENCOUNTER — Ambulatory Visit: Payer: Medicare Other | Attending: Internal Medicine

## 2019-06-25 DIAGNOSIS — Z23 Encounter for immunization: Secondary | ICD-10-CM

## 2019-06-25 NOTE — Progress Notes (Signed)
   Covid-19 Vaccination Clinic  Name:  Eddie [name]    MRN: [id number] DOB: 1950/06/17  06/25/2019  Mr. Eddie [name] was observed post Covid-19 immunization for 15 minutes without incident. He was provided with Vaccine Information Sheet and instruction to access the V-Safe system.   Mr. Eddie [name] was instructed to call 911 with any severe reactions post vaccine: Marland Kitchen Difficulty breathing  . Swelling of face and throat  . A fast heartbeat  . A bad rash all over body  . Dizziness and weakness   Immunizations Administered    Name Date Dose VIS Date Route   Moderna COVID-19 Vaccine 06/25/2019 11:51 AM 0.5 mL 03/03/2019 Intramuscular   Manufacturer: Moderna   Lot: [id number]   [place]: 44458-483-50

## 2019-07-28 ENCOUNTER — Ambulatory Visit: Payer: Medicare Other | Attending: Internal Medicine

## 2019-07-28 DIAGNOSIS — Z23 Encounter for immunization: Secondary | ICD-10-CM

## 2019-07-28 NOTE — Progress Notes (Signed)
   Covid-19 Vaccination Clinic  Name:  Eddie [name]    MRN: [id number] DOB: [birth year]  07/28/2019  Eddie [name] was observed post Covid-19 immunization for 15 minutes without incident. He was provided with Vaccine Information Sheet and instruction to access the V-Safe system.   Eddie [name] was instructed to call 911 with any severe reactions post vaccine: Marland Kitchen Difficulty breathing  . Swelling of face and throat  . A fast heartbeat  . A bad rash all over body  . Dizziness and weakness   Immunizations Administered    Name Date Dose VIS Date Route   Moderna COVID-19 Vaccine 07/28/2019 11:18 AM 0.5 mL 03/2019 Intramuscular   Manufacturer: Moderna   Lot: [id number]   [place]: [id number]

## 2019-09-17 ENCOUNTER — Ambulatory Visit: Payer: Medicaid Other | Admitting: Internal Medicine

## 2019-10-07 ENCOUNTER — Encounter (INDEPENDENT_AMBULATORY_CARE_PROVIDER_SITE_OTHER): Payer: Self-pay | Admitting: Nurse Practitioner

## 2019-10-07 ENCOUNTER — Ambulatory Visit (INDEPENDENT_AMBULATORY_CARE_PROVIDER_SITE_OTHER): Payer: Medicare Other | Admitting: Nurse Practitioner

## 2019-10-07 ENCOUNTER — Other Ambulatory Visit: Payer: Self-pay

## 2019-10-07 VITALS — BP 160/90 | HR 92 | Temp 97.3°F | Ht 78.0 in | Wt 193.4 lb

## 2019-10-07 DIAGNOSIS — E785 Hyperlipidemia, unspecified: Secondary | ICD-10-CM

## 2019-10-07 DIAGNOSIS — Z8673 Personal history of transient ischemic attack (TIA), and cerebral infarction without residual deficits: Secondary | ICD-10-CM

## 2019-10-07 DIAGNOSIS — F209 Schizophrenia, unspecified: Secondary | ICD-10-CM

## 2019-10-07 DIAGNOSIS — Z131 Encounter for screening for diabetes mellitus: Secondary | ICD-10-CM

## 2019-10-07 DIAGNOSIS — Z8679 Personal history of other diseases of the circulatory system: Secondary | ICD-10-CM

## 2019-10-07 DIAGNOSIS — Z1329 Encounter for screening for other suspected endocrine disorder: Secondary | ICD-10-CM

## 2019-10-07 NOTE — Patient Instructions (Addendum)
Check your blood pressure at home every day for the next 7 days.  If you see 2 consecutive blood pressures of greater than 140 on the top and/or greater than 90 on the bottom please call this office so we can make adjustments to your medications prior to your next office visit.

## 2019-10-07 NOTE — Progress Notes (Signed)
Subjective:  Patient ID: Eddie [name], male    DOB: Apr 15, 1950  Age: 69 y.o. MRN: [id number]  CC:  Chief Complaint  Patient presents with  . Other    establish care      HPI  This patient arrives today for the above.  He is a 69 year old male with past medical history significant for stroke, chronic kidney disease, hypertension, schizophrenia, and carotid artery occlusion.  He is arriving today to establish care because his previous primary care provider has retired.  He is accompanied today by his family member.  Per chart review I do see that he had a carotid artery ultrasound completed back in 2016.  It did show significant plaque formation, it is not clear whether or not the patient had follow-up with vascular surgeon for treatment.  Patient was on daily aspirin and atorvastatin, however today he tells me he is no longer taking the medication.  The patient is feeling member says that his previous provider had discontinued this medication.  Patient does have a history of schizophrenia and is followed by psychiatry regularly.  The patient's son member tells me that he has a monthly injection for his antipsychotic administered.  Patient does state he is having hallucinations that are "scary".  He denies any thoughts or urges of suicidal homicidal ideation.   Past Medical History:  Diagnosis Date  . Carotid artery occlusion   . Chronic kidney disease   . Hypertension   . Schizophrenia ([place])   . Stroke [facility])       Family History  Problem Relation Age of Onset  . Heart disease Other   . Diabetes Other   . Kidney disease Other     Social History   Social History Narrative  . Not on file   Social History   Tobacco Use  . Smoking status: Current Every Day Smoker    Packs/day: 1.00    Years: 50.00    Pack years: 50.00    Types: Cigarettes  Substance Use Topics  . Alcohol use: No    Alcohol/week: 0.0 standard drinks     Current Meds  Medication Sig  .  amLODipine (NORVASC) 5 MG tablet Take 5 mg by mouth daily.  . paliperidone (INVEGA SUSTENNA) 156 MG/ML SUSY injection Inject 156 mg into the muscle every 30 (thirty) days.    ROS:  Review of Systems  Constitutional: Negative.   Respiratory: Negative.   Cardiovascular: Negative.   Neurological: Negative.   Psychiatric/Behavioral: Positive for hallucinations. Negative for suicidal ideas.       (-) HI     Objective:   Today's Vitals: BP (!) 160/90 (BP Location: Left Arm, Patient Position: Sitting, Cuff Size: Normal)   Pulse 92   Temp (!) 97.3 F (36.3 C) (Temporal)   Ht 6' 6"  (1.981 m)   Wt 193 lb 6.4 oz (87.7 kg)   SpO2 96%   BMI 22.35 kg/m  Vitals with BMI 10/07/2019 03/11/2015 03/11/2015  Height 6' 6"  - -  Weight 193 lbs 6 oz - -  BMI 72.09 - -  Systolic 470 962 -  Diastolic 90 73 -  Pulse 92 - 80     Physical Exam Vitals reviewed.  Constitutional:      Appearance: Normal appearance.  HENT:     Head: Normocephalic and atraumatic.  Cardiovascular:     Rate and Rhythm: Normal rate and regular rhythm.  Pulmonary:     Effort: Pulmonary effort is normal.  Breath sounds: Wheezing present.  Musculoskeletal:     Cervical back: Neck supple.  Skin:    General: Skin is warm and dry.  Neurological:     Mental Status: He is alert and oriented to person, place, and time.  Psychiatric:        Mood and Affect: Mood normal.        Behavior: Behavior normal.        Thought Content: Thought content normal.        Judgment: Judgment normal.          Assessment and Plan   1. Hyperlipidemia, unspecified hyperlipidemia type   2. Hx of TIA (transient ischemic attack) and stroke   3. Screening for diabetes mellitus   4. Schizophrenia, unspecified type ([place])   5. Thyroid disorder screen   6. History of carotid artery stenosis      Plan: 1.-6.  I will collect blood work today for further evaluation.  I will also send patient for repeat carotid duplex ultrasound of his  bilateral carotid arteries.  No bruit was noted on exam today.  Further recommendations may be made based upon these results.  Patient was encouraged to follow-up with a psychiatrist for treatment and management of schizophrenia.  Blood pressure is above goal today.  I did discuss this with the patient and his family member, the family member is agreeable to checking the patient's blood pressure daily for the next 7 days at home.  I did ask that if the patient's blood pressure is greater than 038 systolically, or greater than 90 diastolically for 2 days or more in a row that they call this office so we can increase the patient's amlodipine.  The patient and the family member tell me they understand and are agreeable to this.   Tests ordered Orders Placed This Encounter  Procedures  . US Carotid Duplex Bilateral  . CBC  . CMP with eGFR([facility])  . Lipid Panel  . TSH      No orders of the defined types were placed in this encounter.   Patient to follow-up in 1 month for annual physical exam, or sooner as needed.  Ailene Ards, NP

## 2019-10-13 ENCOUNTER — Ambulatory Visit (HOSPITAL_COMMUNITY)
Admission: RE | Admit: 2019-10-13 | Discharge: 2019-10-13 | Disposition: A | Discharge status: Home / Self Care | Payer: Medicare Other | Source: Ambulatory Visit | Attending: Nurse Practitioner | Admitting: Nurse Practitioner

## 2019-10-13 ENCOUNTER — Other Ambulatory Visit: Payer: Self-pay

## 2019-10-13 DIAGNOSIS — Z8673 Personal history of transient ischemic attack (TIA), and cerebral infarction without residual deficits: Secondary | ICD-10-CM

## 2019-10-13 DIAGNOSIS — Z8679 Personal history of other diseases of the circulatory system: Secondary | ICD-10-CM

## 2019-10-19 ENCOUNTER — Other Ambulatory Visit (INDEPENDENT_AMBULATORY_CARE_PROVIDER_SITE_OTHER): Payer: Self-pay | Admitting: Nurse Practitioner

## 2019-10-19 DIAGNOSIS — I6523 Occlusion and stenosis of bilateral carotid arteries: Secondary | ICD-10-CM

## 2019-11-10 ENCOUNTER — Ambulatory Visit (INDEPENDENT_AMBULATORY_CARE_PROVIDER_SITE_OTHER): Payer: Medicare Other | Admitting: Vascular Surgery

## 2019-11-10 ENCOUNTER — Encounter: Payer: Self-pay | Admitting: Vascular Surgery

## 2019-11-10 ENCOUNTER — Other Ambulatory Visit: Payer: Self-pay

## 2019-11-10 VITALS — BP 166/100 | HR 103 | Temp 98.2°F | Resp 18 | Ht 71.0 in | Wt 186.6 lb

## 2019-11-10 DIAGNOSIS — I6523 Occlusion and stenosis of bilateral carotid arteries: Secondary | ICD-10-CM | POA: Diagnosis not present

## 2019-11-10 NOTE — Progress Notes (Signed)
Vascular and Vein Specialist of [facility]  Patient name: Eddie [name] MRN: [id number] DOB: [birth year] Sex: male  REASON FOR CONSULT: Discussion recent carotid duplex showing severe carotid disease  HPI: Eddie [name] is a 69 y.o. male, who is here today for discussion.  He is here today with his nephew and also discussing with telephone with his niece who is power of attorney.  Patient has schizophrenia and does need help with daily activities.  I had an extensive evaluation with him in 2016.  At that time he underwent carotid duplex suggesting bilateral internal carotid artery occlusions and bilateral vertebral artery occlusions.  He underwent CT angiogram at that time which suggested the same and then underwent formal carotid four-vessel arteriography again confirming complete occlusion of all 4 vessels.  He has collateral flow mainly through the basilar artery.  He has had no new neurologic deficits.  Past Medical History:  Diagnosis Date  . Carotid artery occlusion   . Chronic kidney disease   . Hypertension   . Schizophrenia ([place])   . Stroke [facility])     Family History  Problem Relation Age of Onset  . Heart disease Other   . Diabetes Other   . Kidney disease Other     SOCIAL HISTORY: Social History   Socioeconomic History  . Marital status: Single    Spouse name: Not on file  . Number of children: Not on file  . Years of education: 27  . Highest education level: Not on file  Occupational History  . Not on file  Tobacco Use  . Smoking status: Current Every Day Smoker    Packs/day: 1.00    Years: 50.00    Pack years: 50.00    Types: Cigarettes  . Smokeless tobacco: Never Used  Substance and Sexual Activity  . Alcohol use: No    Alcohol/week: 0.0 standard drinks  . Drug use: No  . Sexual activity: Not on file  Other Topics Concern  . Not on file  Social History Narrative  . Not on file   Social Determinants of Health    Financial Resource Strain:   . Difficulty of Paying Living Expenses:   Food Insecurity:   . Worried About Charity fundraiser in the Last Year:   . Arboriculturist in the Last Year:   Transportation Needs:   . Film/video editor (Medical):   Marland Kitchen Lack of Transportation (Non-Medical):   Physical Activity:   . Days of Exercise per Week:   . Minutes of Exercise per Session:   Stress:   . Feeling of Stress :   Social Connections:   . Frequency of Communication with Friends and Family:   . Frequency of Social Gatherings with Friends and Family:   . Attends Religious Services:   . Active Member of Clubs or Organizations:   . Attends Archivist Meetings:   Marland Kitchen Marital Status:   Intimate Partner Violence:   . Fear of Current or Ex-Partner:   . Emotionally Abused:   Marland Kitchen Physically Abused:   . Sexually Abused:     No Known Allergies  Current Outpatient Medications  Medication Sig Dispense Refill  . amLODipine (NORVASC) 5 MG tablet Take 5 mg by mouth daily.    . paliperidone (INVEGA SUSTENNA) 156 MG/ML SUSY injection Inject 156 mg into the muscle every 30 (thirty) days.     No current facility-administered medications for this visit.    REVIEW OF SYSTEMS:  [X]   denotes positive finding, [ ]  denotes negative finding Cardiac  Comments:  Chest pain or chest pressure:    Shortness of breath upon exertion:    Short of breath when lying flat:    Irregular heart rhythm:        Vascular    Pain in calf, thigh, or hip brought on by ambulation:    Pain in feet at night that wakes you up from your sleep:     Blood clot in your veins:    Leg swelling:         Pulmonary    Oxygen at home:    Productive cough:     Wheezing:         Neurologic    Sudden weakness in arms or legs:     Sudden numbness in arms or legs:     Sudden onset of difficulty speaking or slurred speech:    Temporary loss of vision in one eye:     Problems with dizziness:         Gastrointestinal     Blood in stool:     Vomited blood:         Genitourinary    Burning when urinating:     Blood in urine:        Psychiatric    Major depression:         Hematologic    Bleeding problems:    Problems with blood clotting too easily:        Skin    Rashes or ulcers:        Constitutional    Fever or chills:      PHYSICAL EXAM: Vitals:   11/10/19 1041 11/10/19 1044  BP: (!) 156/89 (!) 166/100  Pulse: (!) 103   Resp: 18   Temp: 98.2 F (36.8 C)   TempSrc: Temporal   SpO2: 94%   Weight: 186 lb 9.6 oz (84.6 kg)   Height: 5\' 11"  (1.803 m)     GENERAL: The patient is a well-nourished male, in no acute distress. The vital signs are documented above. CARDIOVASCULAR: Carotid arteries without bruits.  2+ radial pulses bilaterally PULMONARY: There is good air exchange  ABDOMEN: Soft and non-tender  MUSCULOSKELETAL: There are no major deformities or cyanosis. NEUROLOGIC: No focal weakness or paresthesias are detected. SKIN: There are no ulcers or rashes noted. PSYCHIATRIC: The patient has a normal affect.  DATA:  Carotid duplex from 10/13/2019 was reviewed.  This showed no change with known occlusion of all 4 cerebral vessels  MEDICAL ISSUES: Discussed this with the patient and his family.  Explained that certainly would be preferable not to have occlusion of both carotid and both vertebral arteries but that he has continued to have adequate collateral flow with no symptoms.  Explained there is no treatment options for these occlusions.  Would not recommend any ongoing follow-up.  They are comfortable with this discussion will see [place] again on an as-needed basis   Rosetta Posner, MD [facility] Specialists of [facility] Tel [phone] Pager [phone]

## 2019-11-18 ENCOUNTER — Other Ambulatory Visit: Payer: Self-pay

## 2019-11-18 ENCOUNTER — Ambulatory Visit (INDEPENDENT_AMBULATORY_CARE_PROVIDER_SITE_OTHER): Payer: Medicare Other | Admitting: Internal Medicine

## 2019-11-18 ENCOUNTER — Encounter (INDEPENDENT_AMBULATORY_CARE_PROVIDER_SITE_OTHER): Payer: Self-pay | Admitting: Internal Medicine

## 2019-11-18 VITALS — BP 152/85 | HR 75 | Temp 97.3°F | Resp 18 | Ht 71.0 in | Wt 182.8 lb

## 2019-11-18 DIAGNOSIS — I6523 Occlusion and stenosis of bilateral carotid arteries: Secondary | ICD-10-CM | POA: Diagnosis not present

## 2019-11-18 DIAGNOSIS — I10 Essential (primary) hypertension: Secondary | ICD-10-CM | POA: Diagnosis not present

## 2019-11-18 DIAGNOSIS — E785 Hyperlipidemia, unspecified: Secondary | ICD-10-CM | POA: Diagnosis not present

## 2019-11-18 MED ORDER — AMLODIPINE BESYLATE 10 MG PO TABS: 10.0000 mg | ORAL_TABLET | Freq: Every day | ORAL | 1 refills | Status: AC

## 2019-11-18 NOTE — Progress Notes (Signed)
Metrics: Intervention Frequency ACO  Documented Smoking Status Yearly  Screened one or more times in 24 months  Cessation Counseling or  Active cessation medication Past 24 months  Past 24 months   Guideline developer: UpToDate (See UpToDate for funding source) Date Released: 2014       Wellness Office Visit  Subjective:  Patient ID: Eddie [name], male    DOB: 1950/06/30  Age: 69 y.o. MRN: [id number]  CC: This man comes in for follow-up of hypertension, chronic kidney disease, carotid artery occlusion. HPI  He was seen by vascular surgery, Dr. Donnetta Hutching and the recommendation was no surgery and no further recommendations at this point.  He appears to have several collaterals which are avoiding him getting a stroke. He continues on amlodipine for his hypertension. He takes [place] for schizophrenia and follows with psychiatry. Past Medical History:  Diagnosis Date  . Carotid artery occlusion   . Chronic kidney disease   . Hypertension   . Schizophrenia ([place])   . Stroke [facility])    History reviewed. No pertinent surgical history.   Family History  Problem Relation Age of Onset  . Heart disease Other   . Diabetes Other   . Kidney disease Other     Social History   Social History Narrative  . Not on file   Social History   Tobacco Use  . Smoking status: Current Every Day Smoker    Packs/day: 1.00    Years: 50.00    Pack years: 50.00    Types: Cigarettes  . Smokeless tobacco: Never Used  Substance Use Topics  . Alcohol use: No    Alcohol/week: 0.0 standard drinks    Current Meds  Medication Sig  . amLODipine (NORVASC) 5 MG tablet Take 5 mg by mouth daily.  . paliperidone (INVEGA SUSTENNA) 156 MG/ML SUSY injection Inject 156 mg into the muscle every 30 (thirty) days.       Depression screen [facility] 2/9 11/18/2019  Decreased Interest 0  Down, Depressed, Hopeless 0  PHQ - 2 Score 0     Objective:   Today's Vitals: BP (!) 152/85 (BP Location: Left Arm, Patient  Position: Sitting, Cuff Size: Normal)   Pulse 75   Temp (!) 97.3 F (36.3 C) (Temporal)   Resp 18   Ht 5\' 11"  (1.803 m)   Wt 182 lb 12.8 oz (82.9 kg)   SpO2 97%   BMI 25.50 kg/m  Vitals with BMI 11/18/2019 11/10/2019 11/10/2019  Height 5\' 11"  - 5\' 11"   Weight 182 lbs 13 oz - 186 lbs 10 oz  BMI 56.81 - 27.51  Systolic 700 174 944  Diastolic 85 967 89  Pulse 75 - 103     Physical Exam His hypertension is not controlled.  Otherwise he is alert and oriented.      Assessment   1. Bilateral carotid artery stenosis   2. Hyperlipidemia, unspecified hyperlipidemia type   3. Essential hypertension, benign       Tests ordered No orders of the defined types were placed in this encounter.    Plan: 1. I am going to increase his amlodipine to 10 mg daily and I have sent a new prescription for this. 2. Blood work had previously been ordered about 6 weeks ago and he will get this blood work done now. 3. Follow-up with Judson Roch in about 3 months time.   Meds ordered this encounter  Medications  . amLODipine (NORVASC) 10 MG tablet    Sig: Take 1 tablet (  10 mg total) by mouth daily.    Dispense:  90 tablet    Refill:  1    New dose    Acel Natzke Luther Parody, MD

## 2019-11-19 ENCOUNTER — Other Ambulatory Visit (INDEPENDENT_AMBULATORY_CARE_PROVIDER_SITE_OTHER): Payer: Self-pay | Admitting: Nurse Practitioner

## 2019-11-19 ENCOUNTER — Encounter (INDEPENDENT_AMBULATORY_CARE_PROVIDER_SITE_OTHER): Payer: Self-pay | Admitting: Nurse Practitioner

## 2019-11-19 DIAGNOSIS — N184 Chronic kidney disease, stage 4 (severe): Secondary | ICD-10-CM

## 2019-11-19 LAB — COMPLETE METABOLIC PANEL WITH GFR
AG Ratio: 1.2 (calc) (ref 1.0–2.5)
ALT: 7 U/L — ABNORMAL LOW (ref 9–46)
AST: 12 U/L (ref 10–35)
Albumin: 3.8 g/dL (ref 3.6–5.1)
Alkaline phosphatase (APISO): 106 U/L (ref 35–144)
BUN/Creatinine Ratio: 14 (calc) (ref 6–22)
BUN: 42 mg/dL — ABNORMAL HIGH (ref 7–25)
CO2: 25 mmol/L (ref 20–32)
Calcium: 8.5 mg/dL — ABNORMAL LOW (ref 8.6–10.3)
Chloride: 106 mmol/L (ref 98–110)
Creat: 3.04 mg/dL — ABNORMAL HIGH (ref 0.70–1.25)
GFR, Est African American: 23 mL/min/{1.73_m2} — ABNORMAL LOW (ref >=60)
GFR, Est Non African American: 20 mL/min/{1.73_m2} — ABNORMAL LOW (ref >=60)
Globulin: 3.3 g/dL (calc) (ref 1.9–3.7)
Glucose, Bld: 100 mg/dL — ABNORMAL HIGH (ref 65–99)
Potassium: 4.9 mmol/L (ref 3.5–5.3)
Sodium: 137 mmol/L (ref 135–146)
Total Bilirubin: 0.5 mg/dL (ref 0.2–1.2)
Total Protein: 7.1 g/dL (ref 6.1–8.1)

## 2019-11-19 LAB — LIPID PANEL
Cholesterol: 121 mg/dL (ref <200)
HDL: 19 mg/dL — ABNORMAL LOW (ref >=40)
LDL Cholesterol (Calc): 79 mg/dL (calc)
Non-HDL Cholesterol (Calc): 102 mg/dL (calc) (ref <130)
Total CHOL/HDL Ratio: 6.4 (calc) — ABNORMAL HIGH (ref <5.0)
Triglycerides: 135 mg/dL (ref <150)

## 2019-11-19 LAB — CBC
HCT: 33.2 % — ABNORMAL LOW (ref 38.5–50.0)
Hemoglobin: 11.2 g/dL — ABNORMAL LOW (ref 13.2–17.1)
MCH: 32 pg (ref 27.0–33.0)
MCHC: 33.7 g/dL (ref 32.0–36.0)
MCV: 94.9 fL (ref 80.0–100.0)
MPV: 10.4 fL (ref 7.5–12.5)
Platelets: 236 10*3/uL (ref 140–400)
RBC: 3.5 10*6/uL — ABNORMAL LOW (ref 4.20–5.80)
RDW: 17.2 % — ABNORMAL HIGH (ref 11.0–15.0)
WBC: 5.4 10*3/uL (ref 3.8–10.8)

## 2019-11-19 LAB — TSH: TSH: 0.86 mIU/L (ref 0.40–4.50)

## 2019-11-19 NOTE — Progress Notes (Signed)
Urgent referral to nephrology

## 2019-12-01 NOTE — Progress Notes (Signed)
Referred to Dr Illene Labrador office-[place].

## 2019-12-16 ENCOUNTER — Other Ambulatory Visit (HOSPITAL_COMMUNITY): Payer: Self-pay | Admitting: Nephrology

## 2019-12-16 ENCOUNTER — Other Ambulatory Visit: Payer: Self-pay | Admitting: Nephrology

## 2019-12-16 DIAGNOSIS — I6523 Occlusion and stenosis of bilateral carotid arteries: Secondary | ICD-10-CM

## 2019-12-16 DIAGNOSIS — D638 Anemia in other chronic diseases classified elsewhere: Secondary | ICD-10-CM

## 2019-12-16 DIAGNOSIS — I129 Hypertensive chronic kidney disease with stage 1 through stage 4 chronic kidney disease, or unspecified chronic kidney disease: Secondary | ICD-10-CM

## 2019-12-16 DIAGNOSIS — Z79899 Other long term (current) drug therapy: Secondary | ICD-10-CM

## 2019-12-16 DIAGNOSIS — N184 Chronic kidney disease, stage 4 (severe): Secondary | ICD-10-CM

## 2019-12-24 ENCOUNTER — Encounter (HOSPITAL_COMMUNITY): Payer: Self-pay

## 2019-12-24 ENCOUNTER — Ambulatory Visit (HOSPITAL_COMMUNITY)
Admission: RE | Admit: 2019-12-24 | Discharge: 2019-12-24 | Disposition: A | Discharge status: Home / Self Care | Payer: Medicare Other | Source: Ambulatory Visit | Attending: Nephrology | Admitting: Nephrology

## 2019-12-24 ENCOUNTER — Ambulatory Visit (HOSPITAL_COMMUNITY): Payer: Medicaid Other

## 2019-12-24 ENCOUNTER — Other Ambulatory Visit: Payer: Self-pay

## 2019-12-24 DIAGNOSIS — Z79899 Other long term (current) drug therapy: Secondary | ICD-10-CM | POA: Insufficient documentation

## 2019-12-24 DIAGNOSIS — I6523 Occlusion and stenosis of bilateral carotid arteries: Secondary | ICD-10-CM | POA: Insufficient documentation

## 2019-12-24 DIAGNOSIS — D638 Anemia in other chronic diseases classified elsewhere: Secondary | ICD-10-CM | POA: Diagnosis present

## 2019-12-24 DIAGNOSIS — N184 Chronic kidney disease, stage 4 (severe): Secondary | ICD-10-CM | POA: Insufficient documentation

## 2019-12-24 DIAGNOSIS — I129 Hypertensive chronic kidney disease with stage 1 through stage 4 chronic kidney disease, or unspecified chronic kidney disease: Secondary | ICD-10-CM

## 2019-12-28 ENCOUNTER — Other Ambulatory Visit: Payer: Self-pay | Admitting: Nephrology

## 2019-12-28 ENCOUNTER — Other Ambulatory Visit (HOSPITAL_COMMUNITY): Payer: Self-pay | Admitting: Nephrology

## 2019-12-28 DIAGNOSIS — I129 Hypertensive chronic kidney disease with stage 1 through stage 4 chronic kidney disease, or unspecified chronic kidney disease: Secondary | ICD-10-CM

## 2019-12-28 DIAGNOSIS — I6523 Occlusion and stenosis of bilateral carotid arteries: Secondary | ICD-10-CM

## 2019-12-28 DIAGNOSIS — Z79899 Other long term (current) drug therapy: Secondary | ICD-10-CM

## 2019-12-28 DIAGNOSIS — N184 Chronic kidney disease, stage 4 (severe): Secondary | ICD-10-CM

## 2019-12-28 DIAGNOSIS — D638 Anemia in other chronic diseases classified elsewhere: Secondary | ICD-10-CM

## 2020-01-14 ENCOUNTER — Other Ambulatory Visit (HOSPITAL_COMMUNITY)
Admission: RE | Admit: 2020-01-14 | Discharge: 2020-01-14 | Disposition: A | Discharge status: Home / Self Care | Payer: Medicare Other | Source: Ambulatory Visit | Attending: Nephrology | Admitting: Nephrology

## 2020-01-14 DIAGNOSIS — N179 Acute kidney failure, unspecified: Secondary | ICD-10-CM | POA: Diagnosis present

## 2020-01-14 LAB — RENAL FUNCTION PANEL
Albumin: 3.4 g/dL — ABNORMAL LOW (ref 3.5–5.0)
Anion gap: 11 (ref 5–15)
BUN: 76 mg/dL — ABNORMAL HIGH (ref 8–23)
CO2: 21 mmol/L — ABNORMAL LOW (ref 22–32)
Calcium: 8 mg/dL — ABNORMAL LOW (ref 8.9–10.3)
Chloride: 99 mmol/L (ref 98–111)
Creatinine, Ser: 11.73 mg/dL — ABNORMAL HIGH (ref 0.61–1.24)
GFR, Estimated: 4 mL/min — ABNORMAL LOW (ref >60)
Glucose, Bld: 108 mg/dL — ABNORMAL HIGH (ref 70–99)
Phosphorus: 6 mg/dL — ABNORMAL HIGH (ref 2.5–4.6)
Potassium: 4.9 mmol/L (ref 3.5–5.1)
Sodium: 131 mmol/L — ABNORMAL LOW (ref 135–145)

## 2020-01-29 ENCOUNTER — Inpatient Hospital Stay (HOSPITAL_COMMUNITY)
Admission: EM | Admit: 2020-01-29 | Discharge: 2020-02-01 | DRG: 812 | Disposition: A | Discharge status: Home Health | Payer: Medicare Other | Attending: Family Medicine | Admitting: Family Medicine

## 2020-01-29 ENCOUNTER — Emergency Department (HOSPITAL_COMMUNITY): Payer: Medicare Other

## 2020-01-29 ENCOUNTER — Other Ambulatory Visit: Payer: Self-pay

## 2020-01-29 ENCOUNTER — Encounter (HOSPITAL_COMMUNITY): Payer: Self-pay

## 2020-01-29 DIAGNOSIS — D631 Anemia in chronic kidney disease: Secondary | ICD-10-CM | POA: Diagnosis not present

## 2020-01-29 DIAGNOSIS — Z8673 Personal history of transient ischemic attack (TIA), and cerebral infarction without residual deficits: Secondary | ICD-10-CM | POA: Diagnosis not present

## 2020-01-29 DIAGNOSIS — R531 Weakness: Secondary | ICD-10-CM | POA: Diagnosis present

## 2020-01-29 DIAGNOSIS — E538 Deficiency of other specified B group vitamins: Secondary | ICD-10-CM

## 2020-01-29 DIAGNOSIS — Y9301 Activity, walking, marching and hiking: Secondary | ICD-10-CM | POA: Diagnosis not present

## 2020-01-29 DIAGNOSIS — I10 Essential (primary) hypertension: Secondary | ICD-10-CM

## 2020-01-29 DIAGNOSIS — Z79899 Other long term (current) drug therapy: Secondary | ICD-10-CM | POA: Diagnosis not present

## 2020-01-29 DIAGNOSIS — W19XXXA Unspecified fall, initial encounter: Secondary | ICD-10-CM

## 2020-01-29 DIAGNOSIS — Y92008 Other place in unspecified non-institutional (private) residence as the place of occurrence of the external cause: Secondary | ICD-10-CM | POA: Diagnosis not present

## 2020-01-29 DIAGNOSIS — D7589 Other specified diseases of blood and blood-forming organs: Secondary | ICD-10-CM | POA: Diagnosis present

## 2020-01-29 DIAGNOSIS — M6281 Muscle weakness (generalized): Secondary | ICD-10-CM | POA: Diagnosis not present

## 2020-01-29 DIAGNOSIS — N185 Chronic kidney disease, stage 5: Secondary | ICD-10-CM | POA: Diagnosis not present

## 2020-01-29 DIAGNOSIS — D649 Anemia, unspecified: Principal | ICD-10-CM

## 2020-01-29 DIAGNOSIS — R54 Age-related physical debility: Secondary | ICD-10-CM | POA: Diagnosis present

## 2020-01-29 DIAGNOSIS — I120 Hypertensive chronic kidney disease with stage 5 chronic kidney disease or end stage renal disease: Secondary | ICD-10-CM | POA: Diagnosis not present

## 2020-01-29 DIAGNOSIS — Z66 Do not resuscitate: Secondary | ICD-10-CM | POA: Diagnosis present

## 2020-01-29 DIAGNOSIS — W010XXA Fall on same level from slipping, tripping and stumbling without subsequent striking against object, initial encounter: Secondary | ICD-10-CM | POA: Diagnosis present

## 2020-01-29 DIAGNOSIS — F209 Schizophrenia, unspecified: Secondary | ICD-10-CM | POA: Diagnosis present

## 2020-01-29 DIAGNOSIS — Z20822 Contact with and (suspected) exposure to covid-19: Secondary | ICD-10-CM | POA: Diagnosis present

## 2020-01-29 DIAGNOSIS — I6529 Occlusion and stenosis of unspecified carotid artery: Secondary | ICD-10-CM | POA: Diagnosis present

## 2020-01-29 DIAGNOSIS — D510 Vitamin B12 deficiency anemia due to intrinsic factor deficiency: Secondary | ICD-10-CM

## 2020-01-29 DIAGNOSIS — F17210 Nicotine dependence, cigarettes, uncomplicated: Secondary | ICD-10-CM | POA: Diagnosis not present

## 2020-01-29 LAB — URINALYSIS, ROUTINE W REFLEX MICROSCOPIC
Bilirubin Urine: NEGATIVE
Glucose, UA: NEGATIVE mg/dL
Ketones, ur: 5 mg/dL — AB
Nitrite: NEGATIVE
Protein, ur: >=300 mg/dL — AB
Specific Gravity, Urine: 1.012 (ref 1.005–1.030)
pH: 6 (ref 5.0–8.0)

## 2020-01-29 LAB — COMPREHENSIVE METABOLIC PANEL
ALT: 11 U/L (ref 0–44)
AST: 14 U/L — ABNORMAL LOW (ref 15–41)
Albumin: 3.6 g/dL (ref 3.5–5.0)
Alkaline Phosphatase: 80 U/L (ref 38–126)
Anion gap: 13 (ref 5–15)
BUN: 91 mg/dL — ABNORMAL HIGH (ref 8–23)
CO2: 17 mmol/L — ABNORMAL LOW (ref 22–32)
Calcium: 8.1 mg/dL — ABNORMAL LOW (ref 8.9–10.3)
Chloride: 99 mmol/L (ref 98–111)
Creatinine, Ser: 13.9 mg/dL — ABNORMAL HIGH (ref 0.61–1.24)
GFR, Estimated: 4 mL/min — ABNORMAL LOW (ref >60)
Glucose, Bld: 121 mg/dL — ABNORMAL HIGH (ref 70–99)
Potassium: 4.8 mmol/L (ref 3.5–5.1)
Sodium: 129 mmol/L — ABNORMAL LOW (ref 135–145)
Total Bilirubin: 0.8 mg/dL (ref 0.3–1.2)
Total Protein: 7 g/dL (ref 6.5–8.1)

## 2020-01-29 LAB — CBC WITH DIFFERENTIAL/PLATELET
Abs Immature Granulocytes: 0.02 10*3/uL (ref 0.00–0.07)
Basophils Absolute: 0 10*3/uL (ref 0.0–0.1)
Basophils Relative: 0 %
Eosinophils Absolute: 0.1 10*3/uL (ref 0.0–0.5)
Eosinophils Relative: 3 %
HCT: 20.9 % — ABNORMAL LOW (ref 39.0–52.0)
Hemoglobin: 6.7 g/dL — CL (ref 13.0–17.0)
Immature Granulocytes: 0 %
Lymphocytes Relative: 20 %
Lymphs Abs: 0.9 10*3/uL (ref 0.7–4.0)
MCH: 34.2 pg — ABNORMAL HIGH (ref 26.0–34.0)
MCHC: 32.1 g/dL (ref 30.0–36.0)
MCV: 106.6 fL — ABNORMAL HIGH (ref 80.0–100.0)
Monocytes Absolute: 0.3 10*3/uL (ref 0.1–1.0)
Monocytes Relative: 6 %
Neutro Abs: 3.2 10*3/uL (ref 1.7–7.7)
Neutrophils Relative %: 71 %
Platelets: 137 10*3/uL — ABNORMAL LOW (ref 150–400)
RBC: 1.96 MIL/uL — ABNORMAL LOW (ref 4.22–5.81)
RDW: 18.6 % — ABNORMAL HIGH (ref 11.5–15.5)
WBC: 4.5 10*3/uL (ref 4.0–10.5)
nRBC: 0 % (ref 0.0–0.2)

## 2020-01-29 LAB — IRON AND TIBC
Iron: 49 ug/dL (ref 45–182)
Saturation Ratios: 24 % (ref 17.9–39.5)
TIBC: 201 ug/dL — ABNORMAL LOW (ref 250–450)
UIBC: 152 ug/dL

## 2020-01-29 LAB — CBC
HCT: 21.6 % — ABNORMAL LOW (ref 39.0–52.0)
Hemoglobin: 7.2 g/dL — ABNORMAL LOW (ref 13.0–17.0)
MCH: 34.1 pg — ABNORMAL HIGH (ref 26.0–34.0)
MCHC: 33.3 g/dL (ref 30.0–36.0)
MCV: 102.4 fL — ABNORMAL HIGH (ref 80.0–100.0)
Platelets: 137 10*3/uL — ABNORMAL LOW (ref 150–400)
RBC: 2.11 MIL/uL — ABNORMAL LOW (ref 4.22–5.81)
RDW: 18.9 % — ABNORMAL HIGH (ref 11.5–15.5)
WBC: 5.9 10*3/uL (ref 4.0–10.5)
nRBC: 0 % (ref 0.0–0.2)

## 2020-01-29 LAB — ABO/RH: ABO/RH(D): O POS

## 2020-01-29 LAB — PREPARE RBC (CROSSMATCH)

## 2020-01-29 LAB — RETICULOCYTES
Immature Retic Fract: 25.7 % — ABNORMAL HIGH (ref 2.3–15.9)
RBC.: 1.89 MIL/uL — ABNORMAL LOW (ref 4.22–5.81)
Retic Count, Absolute: 54.8 10*3/uL (ref 19.0–186.0)
Retic Ct Pct: 2.9 % (ref 0.4–3.1)

## 2020-01-29 LAB — RESPIRATORY PANEL BY RT PCR (FLU A&B, COVID)
Influenza A by PCR: NEGATIVE
Influenza B by PCR: NEGATIVE
SARS Coronavirus 2 by RT PCR: NEGATIVE

## 2020-01-29 LAB — FERRITIN: Ferritin: 639 ng/mL — ABNORMAL HIGH (ref 24–336)

## 2020-01-29 LAB — FOLATE: Folate: 5.2 ng/mL — ABNORMAL LOW (ref >5.9)

## 2020-01-29 LAB — VITAMIN B12: Vitamin B-12: 99 pg/mL — ABNORMAL LOW (ref 180–914)

## 2020-01-29 MED ORDER — HYDRALAZINE HCL 20 MG/ML IJ SOLN: 5.0000 mg | Freq: Four times a day (QID) | INTRAMUSCULAR | Status: DC | PRN

## 2020-01-29 MED ORDER — ACETAMINOPHEN 325 MG PO TABS: 650.0000 mg | ORAL_TABLET | Freq: Four times a day (QID) | ORAL | Status: DC | PRN

## 2020-01-29 MED ORDER — AMLODIPINE BESYLATE 5 MG PO TABS
10.0000 mg | ORAL_TABLET | Freq: Every day | ORAL | Status: DC
  Administered 2020-01-30 – 2020-02-01 (×3): 10 mg via ORAL
  Filled 2020-01-29 (×3): qty 2

## 2020-01-29 MED ORDER — SODIUM CHLORIDE 0.9 % IV SOLN
10.0000 mL/h | Freq: Once | INTRAVENOUS | Status: AC
  Administered 2020-01-29: 10 mL/h via INTRAVENOUS

## 2020-01-29 MED ORDER — ACETAMINOPHEN 650 MG RE SUPP: 650.0000 mg | Freq: Four times a day (QID) | RECTAL | Status: DC | PRN

## 2020-01-29 MED ORDER — NEPRO/CARBSTEADY PO LIQD: 237.0000 mL | Freq: Two times a day (BID) | ORAL | Status: DC

## 2020-01-29 MED ORDER — NEPRO/CARBSTEADY PO LIQD
237.0000 mL | Freq: Two times a day (BID) | ORAL | Status: DC
  Administered 2020-01-30 – 2020-02-01 (×3): 237 mL via ORAL

## 2020-01-29 NOTE — ED Notes (Signed)
Report to Maudie Mercury, RN   To floor via stretcher

## 2020-01-29 NOTE — ED Notes (Signed)
Date and time results received: 01/29/20 1259 (use smartphrase ".now" to insert current time)  Test: Hemoglobin Critical Value: 6.7  Name of Provider Notified: Dr. Regenia Skeeter  Orders Received? Or Actions Taken?: NA

## 2020-01-29 NOTE — ED Notes (Signed)
Bed marked ready   Call for report   Nurse in report will call back

## 2020-01-29 NOTE — ED Notes (Signed)
Pt contact:  Eddie [name] - nephew  [phone]

## 2020-01-29 NOTE — ED Provider Notes (Signed)
[facility] EMERGENCY DEPARTMENT Provider Note   CSN: [id number] Arrival date & time: 01/29/20  1134     History Chief Complaint  Patient presents with  . Fall    Jamire B Hommel is a 69 y.o. male.  Nora B Palencia is a 69 y.o. male with a history of hypertension, schizophrenia, stroke, and CKD, who presents to the emergency department accompanied by his nephew for evaluation after a fall.  Nephew reports that he has been weaker than usual recently, and in particular last night he noticed that he was less mobile because of this.  This morning he was walking outside on a pa[place] pathway when he fell landing on his right side.  The fall was witnessed by the nephew and he did not hit his head or have any loss of consciousness.  Patient states that he was not feeling lightheaded prior to this but has been feeling more weak and fatigued recently, in particular with activity.  He denies any pain since the fall and was able to get up and has ambulated since then.  Denies any chest pain or shortness of breath.  No abdominal pain, vomiting or diarrhea.  No focal pain over the extremities after fall.  No neck or back pain.  Patient is not on any blood thinners.  No other aggravating or alleviating factors.        Past Medical History:  Diagnosis Date  . Carotid artery occlusion   . Chronic kidney disease   . Hypertension   . Schizophrenia ([place])   . Stroke [facility])     Patient Active Problem List   Diagnosis Date Noted  . 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 Ankle fracture, lateral malleolus, closed 10/22/2011  . ALCOHOL USE 05/17/2008  . HEMATOCHEZIA 05/17/2008  . UTI 05/17/2008    History reviewed. No pertinent surgical history.     Family History  Problem Relation Age of Onset  . Heart disease Other   . Diabetes Other   . Kidney disease Other     Social History   Tobacco Use  . Smoking status: Current Every Day Smoker    Packs/day: 1.00    Years: 50.00    Pack years: 50.00     Types: Cigarettes  . Smokeless tobacco: Never Used  Substance Use Topics  . Alcohol use: No    Alcohol/week: 0.0 standard drinks  . Drug use: No    Home Medications Prior to Admission medications   Medication Sig Start Date End Date Taking? Authorizing Provider  amLODipine (NORVASC) 10 MG tablet Take 1 tablet (10 mg total) by mouth daily. 11/18/19   Hurshel Party C, MD  amLODipine (NORVASC) 5 MG tablet Take 5 mg by mouth daily.    [provider]  paliperidone (INVEGA SUSTENNA) 156 MG/ML SUSY injection Inject 156 mg into the muscle every 30 (thirty) days.    [provider]    Allergies    Patient has no known allergies.  Review of Systems   Review of Systems  Constitutional: Positive for fatigue. Negative for chills and fever.  HENT: Negative.   Respiratory: Negative for cough and shortness of breath.   Cardiovascular: Negative for chest pain.  Gastrointestinal: Negative for abdominal pain, blood in stool, nausea and vomiting.  Genitourinary: Negative for dysuria.  Musculoskeletal: Negative for arthralgias, back pain, myalgias and neck pain.  Skin: Negative for color change and rash.  Neurological: Positive for weakness. Negative for headaches.  All other systems reviewed and are  negative.   Physical Exam Updated Vital Signs BP (!) 169/76 (BP Location: Right Arm)   Pulse 86   Temp 97.6 F (36.4 C) (Oral)   Resp 18   Ht 6\' 2"  (1.88 m)   Wt 78 kg   SpO2 100%   BMI 22.08 kg/m   Physical Exam Vitals and nursing note reviewed.  Constitutional:      General: He is not in acute distress.    Appearance: Normal appearance. He is well-developed. He is not diaphoretic.     Comments: Elderly gentleman, chronically ill-appearing but in no acute distress  HENT:     Head: Normocephalic and atraumatic.     Mouth/Throat:     Mouth: Mucous membranes are moist.     Pharynx: Oropharynx is clear.  Eyes:     General:        Right eye: No discharge.         Left eye: No discharge.  Cardiovascular:     Rate and Rhythm: Normal rate and regular rhythm.     Heart sounds: Normal heart sounds. No murmur heard.  No friction rub. No gallop.   Pulmonary:     Effort: Pulmonary effort is normal. No respiratory distress.     Breath sounds: Normal breath sounds. No wheezing or rales.     Comments: Respirations equal and unlabored, patient able to speak in full sentences, lungs clear to auscultation bilaterally, no chest wall tenderness or palpable deformity Abdominal:     General: Bowel sounds are normal. There is no distension.     Palpations: Abdomen is soft. There is no mass.     Tenderness: There is no abdominal tenderness. There is no guarding.     Comments: Abdomen soft, nondistended, nontender to palpation in all quadrants without guarding or peritoneal signs, no flank tenderness  Musculoskeletal:        General: No deformity.     Cervical back: Neck supple.     Comments: T-spine and L-spine nontender to palpation at midline. Patient moves all extremities without difficulty. No focal tenderness over the pelvis, no hematoma or bruising over the hips All joints supple and easily movable, no erythema, swelling or palpable deformity, all compartments soft.  Skin:    General: Skin is warm and dry.     Capillary Refill: Capillary refill takes less than 2 seconds.  Neurological:     Mental Status: He is alert.     Coordination: Coordination normal.     Comments: Speech is clear, able to follow commands CN III-XII intact Normal strength in upper and lower extremities bilaterally including dorsiflexion and plantar flexion, strong and equal grip strength Sensation normal to light and sharp touch Moves extremities without ataxia, coordination intact  Psychiatric:        Mood and Affect: Mood normal.        Behavior: Behavior normal.     ED Results / Procedures / Treatments   Labs (all labs ordered are listed, but only abnormal results are  displayed) Labs Reviewed  COMPREHENSIVE METABOLIC PANEL - Abnormal; Notable for the following components:      Result Value   Sodium 129 (*)    CO2 17 (*)    Glucose, Bld 121 (*)    BUN 91 (*)    Creatinine, Ser 13.90 (*)    Calcium 8.1 (*)    AST 14 (*)    GFR, Estimated 4 (*)    All other components within normal limits  CBC WITH  DIFFERENTIAL/PLATELET - Abnormal; Notable for the following components:   RBC 1.96 (*)    Hemoglobin 6.7 (*)    HCT 20.9 (*)    MCV 106.6 (*)    MCH 34.2 (*)    RDW 18.6 (*)    Platelets 137 (*)    All other components within normal limits  RESPIRATORY PANEL BY RT PCR (FLU A&B, COVID)  URINALYSIS, ROUTINE W REFLEX MICROSCOPIC  POC OCCULT BLOOD, ED  TYPE AND SCREEN  PREPARE RBC (CROSSMATCH)  ABO/RH    EKG None  Radiology DG Chest Port 1 View  Result Date: 01/29/2020 CLINICAL DATA:  Fall, weakness. EXAM: PORTABLE CHEST 1 VIEW COMPARISON:  None. FINDINGS: Similar cardiomediastinal silhouette. Both lungs are clear. No visible pleural effusions or pneumothorax. The visualized skeletal structures are unremarkable. IMPRESSION: No acute cardiopulmonary disease. Electronically Signed   By: Margaretha Sheffield MD   On: 01/29/2020 12:41    Procedures .Critical Care Performed by: Jacqlyn Larsen, PA-C Authorized by: Jacqlyn Larsen, PA-C   Critical care provider statement:    Critical care time (minutes):  45   Critical care was necessary to treat or prevent imminent or life-threatening deterioration of the following conditions:  Circulatory failure (Symptomatic anemia requiring blood transfusion)   Critical care was time spent personally by me on the following activities:  Discussions with consultants, evaluation of patient's response to treatment, examination of patient, ordering and performing treatments and interventions, ordering and review of laboratory studies, ordering and review of radiographic studies, pulse oximetry, re-evaluation of patient's  condition, obtaining history from patient or surrogate and review of old charts   (including critical care time)  Medications Ordered in ED Medications  0.9 %  sodium chloride infusion (has no administration in time range)    ED Course  I have reviewed the triage vital signs and the nursing notes.  Pertinent labs & imaging results that were available during my care of the patient were reviewed by me and considered in my medical decision making (see chart for details).    MDM Rules/Calculators/A&P                         69 year old male presents for evaluation of weakness and fall.  Has been more weak and fatigued recently and today had a witnessed fall without head injury.  He denies any pain or injury from the fall and has no obvious signs of injury on exam.  No focal tenderness over the chest abdomen or pelvis and moving all extremities.  Family reports he has been more weak recently and this has been affecting his mobility.  Will check basic labs, urinalysis, EKG and chest x-ray.  To further evaluate for potential causes for weakness such as electrolyte derangements, anemia, infection.  I have independently ordered, reviewed and interpreted all labs and imaging: CBC: No leukocytosis but patient has had an acute drop in his hemoglobin, today it is 6.7, 2 weeks ago was 9.2, and prior to this it was 11.9.  Patient denies any reported rectal bleeding or melena. CMP: Sodium of 129, creatinine of 13.9, and review patient has had steadily worsening kidney function, most recently 35.  Patient with BUN of 90, CO2 of 17.  Fortunately patient with normal potassium, no derangement LFTs.  Patient with significant laboratory derangements, on chart review it appears patient has been diagnosed with stage V CKD and has seen nephrology at [facility], they have had multiple discussions about dialysis, but  at this time patient refuses dialysis, he reports that his wife died after returning home from dialysis  he also watched all of his siblings do with dialysis and he does not want to do that.  He understands that his kidney disease without dialysis will likely lead to his death but he still declines any dialysis.  His nephew and niece who are his primary caretakers unaware of this they have tried to talk to him about a multiple times but they state that when brought up it often leads to him refusing any and all medical care.  I discussed with patient his low hemoglobin and that this could certainly be contributing to his fatigue.  His Hemoccult here in the ED is negative, but patient is agreeable to blood transfusion and admission.  Patient ordered 1 unit PRBCs.  Medicine consult placed.  Case discussed with Dr. Waldron Labs who will see and admit the patient.   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

## 2020-01-29 NOTE — ED Notes (Signed)
Call to floor for report  Nurse unaware of assignment of this pt   Will call back

## 2020-01-29 NOTE — ED Notes (Signed)
Pt was informed that we need a urine specimen. Pt states that he can not urinate at this time.

## 2020-01-29 NOTE — ED Triage Notes (Signed)
Pt brought to ED for fall this morning. Pt states he tripped and lost his balance. Pt fell on the right side of his body, denies LOC. Denies blood thinners. Pt denies pain.

## 2020-01-29 NOTE — H&P (Signed)
TR[facility] H&P   Patient Demographics:    Eddie [name], is a 69 y.o. male  MRN: [id number]   DOB - [birth year]  Admit Date - 01/29/2020  Outpatient Primary MD for the patient is Doree Albee, MD  Referring MD/NP/PA: PA [place]  Outpatient Specialists: Renal Dr Marylene Buerger    Patient coming from: Home  Chief Complaint  Patient presents with  . Fall      HPI:    Eddie [name]  is a 68 y.o. male, with past medical history of hypertension, chronic kidney disease stage V (followed by Dr. Theador Hawthorne, has been refusing hemodialysis), carotid artery occlusion(no surgical recommendation by vascular surgery), and schizophrenia on Invega. -Patient presents to ED secondary to fall, patient report he did tripped, lost his balance, he denies any dizziness, lightheadedness, loss of consciousness, reports he is not on any blood thinners, he denies any pain currently, patient lives with his nephew who brought him to ED for evaluation, his work-up was significant for anemia with hemoglobin of 6.7, he is Hemoccult negative, he denies any times of GI bleed, no melena, no coffee-ground emesis, no bright red blood per rectum, with known history of CKD stage V, he is followed by renal Dr. Theador Hawthorne, he has been refusing hemodialysis despite started having some uremic symptoms, overall he has been more weak, frail with poor appetite, he denies any fever, chills, chest pain or shortness of breath he denies any fever, chills, chest pain. - in ED HGB is 6.7, hemoccult negative, no bruising or hematoma at fall site, BP elevated at 169/79, he is ordered 1 unit PRBC, [facility] consulted to admit.   Review of systems:    In addition to the HPI above,  No Fever-chills, does report generalized weakness, fatigue, poor appetite No Headache, No changes with Vision or hearing, No problems swallowing food or Liquids, No Chest  pain, Cough or Shortness of Breath, No Abdominal pain, No Nausea or Vommitting, Bowel movements are regular, No Blood in stool or Urine, No dysuria, No new skin rashes or bruises, No new joints pains-aches,  No new weakness, tingling, numbness in any extremity, No recent weight gain or loss, No polyuria, polydypsia or polyphagia, No significant Mental Stressors.  A full 10 point Review of Systems was done, except as stated above, all other Review of Systems were negative.   With Past History of the following :    Past Medical History:  Diagnosis Date  . Carotid artery occlusion   . Chronic kidney disease   . Hypertension   . Schizophrenia ([place])   . Stroke [facility])       History reviewed. No pertinent surgical history.    Social History:     Social History   Tobacco Use  . Smoking status: Current Every Day Smoker    Packs/day: 1.00    Years: 50.00    Pack years: 50.00  Types: Cigarettes  . Smokeless tobacco: Never Used  Substance Use Topics  . Alcohol use: No    Alcohol/week: 0.0 standard drinks       Family History :     Family History  Problem Relation Age of Onset  . Heart disease Other   . Diabetes Other   . Kidney disease Other     Home Medications:   Prior to Admission medications   Medication Sig Start Date End Date Taking? Authorizing Provider  amLODipine (NORVASC) 10 MG tablet Take 1 tablet (10 mg total) by mouth daily. 11/18/19   Hurshel Party C, MD  amLODipine (NORVASC) 5 MG tablet Take 5 mg by mouth daily.    [provider]  paliperidone (INVEGA SUSTENNA) 156 MG/ML SUSY injection Inject 156 mg into the muscle every 30 (thirty) days.    [provider]     Allergies:    No Known Allergies   Physical Exam:   Vitals  Blood pressure (!) 171/153, pulse 89, temperature 97.6 F (36.4 C), temperature source Oral, resp. rate (!) 29, height 6\' 2"  (1.88 m), weight 78 kg, SpO2 100 %.   1. General elderly male, laying in  bed, in no apparent distress  2. Normal affect and insight, Not Suicidal or Homicidal, Awake Alert, Oriented X 3.  3. No F.N deficits, ALL C.Nerves Intact, Strength 5/5 all 4 extremities, Sensation intact all 4 extremities, Plantars down going.  4. Ears and Eyes appear Normal, Conjunctivae clear, PERRLA. Moist Oral Mucosa.  5. Supple Neck, No JVD, No cervical lymphadenopathy appriciated, No Carotid Bruits.  6. Symmetrical Chest wall movement, Good air movement bilaterally, CTAB.  7. RRR, No Gallops, Rubs or Murmurs, No Parasternal Heave.  8. Positive Bowel Sounds, Abdomen Soft, No tenderness, No organomegaly appriciated,No rebound -guarding or rigidity.  9.  No Cyanosis, Normal Skin Turgor, is with dry skin, with some signs of itching  10. Good muscle tone,  joints appear normal , no effusions, Normal ROM.  11. No Palpable Lymph Nodes in Neck or Axillae   Data Review:    CBC Recent Labs  Lab 01/29/20 1232  WBC 4.5  HGB 6.7*  HCT 20.9*  PLT 137*  MCV 106.6*  MCH 34.2*  MCHC 32.1  RDW 18.6*  LYMPHSABS 0.9  MONOABS 0.3  EOSABS 0.1  BASOSABS 0.0   ------------------------------------------------------------------------------------------------------------------  Chemistries  Recent Labs  Lab 01/29/20 1232  NA 129*  K 4.8  CL 99  CO2 17*  GLUCOSE 121*  BUN 91*  CREATININE 13.90*  CALCIUM 8.1*  AST 14*  ALT 11  ALKPHOS 80  BILITOT 0.8   ------------------------------------------------------------------------------------------------------------------ estimated creatinine clearance is 5.7 mL/min (A) (by C-G formula based on SCr of 13.9 mg/dL (H)). ------------------------------------------------------------------------------------------------------------------ No results for input(s): TSH, T4TOTAL, T3FREE, THYROIDAB in the last 72 hours.  Invalid input(s): FREET3  Coagulation profile No results for input(s): INR, PROTIME in the last 168  hours. ------------------------------------------------------------------------------------------------------------------- No results for input(s): DDIMER in the last 72 hours. -------------------------------------------------------------------------------------------------------------------  Cardiac Enzymes No results for input(s): CKMB, TROPONINI, MYOGLOBIN in the last 168 hours.  Invalid input(s): CK ------------------------------------------------------------------------------------------------------------------ No results found for: BNP   ---------------------------------------------------------------------------------------------------------------  Urinalysis    Component Value Date/Time   COLORURINE YELLOW 01/27/2008 1815   APPEARANCEUR CLEAR 01/27/2008 1815   LABSPEC <1.005 (L) 01/27/2008 1815   PHURINE 6.5 01/27/2008 1815   GLUCOSEU NEGATIVE 01/27/2008 1815   HGBUR NEGATIVE 01/27/2008 [place] NEGATIVE 01/27/2008 [place] 01/27/2008 1815  PROTEINUR NEGATIVE 01/27/2008 1815   UROBILINOGEN 1.0 01/27/2008 1815   NITRITE NEGATIVE 01/27/2008 1815   LEUKOCYTESUR  01/27/2008 1815    NEGATIVE MICROSCOPIC NOT DONE ON URINES WITH NEGATIVE PROTEIN, BLOOD, LEUKOCYTES, NITRITE, OR GLUCOSE <1000 mg/dL.    ----------------------------------------------------------------------------------------------------------------   Imaging Results:    DG Chest Port 1 View  Result Date: 01/29/2020 CLINICAL DATA:  Fall, weakness. EXAM: PORTABLE CHEST 1 VIEW COMPARISON:  None. FINDINGS: Similar cardiomediastinal silhouette. Both lungs are clear. No visible pleural effusions or pneumothorax. The visualized skeletal structures are unremarkable. IMPRESSION: No acute cardiopulmonary disease. Electronically Signed   By: Margaretha Sheffield MD   On: 01/29/2020 12:41     Assessment & Plan:    Active Problems:   Essential hypertension   CKD stage 5 secondary to hypertension  (HCC)   Symptomatic anemia   Symptomatic anemia/anemia of chronic kidney disease -Patient with hemoglobin continue to trend down, will obtain anemia panel, will proceed with drainage PRBC transfusion, will repeat after that, and will give another unit if indicated. -No evidence of of GI bleed, normal color stool, Hemoccult negative. -No evidence of significant bruising hematoma from his fall. -Likely will benefit from Neupogen shots by his nephrologist as an outpatient.  CKD stage V -Patient with progressive kidney disease, he has been followed closely by his nephrologist Dr. Theador Hawthorne as an outpatient, he declined hemodialysis in the past. -Even though with significantly elevated creatinine and BUN, he is awake alert oriented x3 appropriate and coherent. -He is still refusing dialysis, please see discussion below.  Hypertension -Continue with Norvasc, will add as needed hydralazine  Schizophrenia -Continue with home medications to be on monthly Invega shots, he is with no delusion, or confusion or any schizophrenia symptoms, he is appropriate and coherent.  Ethics/goals of care -I have discussed at length with the patient, and with his nephew by phone (primary caregiver) , patient renal function continues to worsen, I have explained for patient and nephew that it will continue to deteriorate, and at one point it will result on his death, patient was very clear about not wanting hemodialysis, he understand the consequences, and he did confirm he is DNR, I have explained and offered hospice at this point, but patient reports for now he would like to wait on that for now, and may seek it at a later point.  DVT Prophylaxis SCDs  AM Labs Ordered, also please review Full Orders  Family Communication: Admission, patients condition and plan of care including tests being ordered have been discussed with the patient and nephew by phone Mr Rulon Sera who indicate understanding and agree with the plan  and Code Status.  Code Status dnr  Likely DC to  home  Condition GUARDED    Consults called: none    Admission status: Observation    Time spent in minutes : 55 minutes   Phillips Climes M.D on 01/29/2020 at 4:01 PM   [facility] - Office  [phone]

## 2020-01-30 DIAGNOSIS — I120 Hypertensive chronic kidney disease with stage 5 chronic kidney disease or end stage renal disease: Secondary | ICD-10-CM | POA: Diagnosis not present

## 2020-01-30 DIAGNOSIS — Y92008 Other place in unspecified non-institutional (private) residence as the place of occurrence of the external cause: Secondary | ICD-10-CM | POA: Diagnosis not present

## 2020-01-30 DIAGNOSIS — I6529 Occlusion and stenosis of unspecified carotid artery: Secondary | ICD-10-CM | POA: Diagnosis not present

## 2020-01-30 DIAGNOSIS — D631 Anemia in chronic kidney disease: Secondary | ICD-10-CM | POA: Diagnosis not present

## 2020-01-30 DIAGNOSIS — Y9301 Activity, walking, marching and hiking: Secondary | ICD-10-CM | POA: Diagnosis present

## 2020-01-30 DIAGNOSIS — E538 Deficiency of other specified B group vitamins: Secondary | ICD-10-CM

## 2020-01-30 DIAGNOSIS — R54 Age-related physical debility: Secondary | ICD-10-CM | POA: Diagnosis not present

## 2020-01-30 DIAGNOSIS — Z20822 Contact with and (suspected) exposure to covid-19: Secondary | ICD-10-CM | POA: Diagnosis not present

## 2020-01-30 DIAGNOSIS — Z66 Do not resuscitate: Secondary | ICD-10-CM | POA: Diagnosis not present

## 2020-01-30 DIAGNOSIS — D649 Anemia, unspecified: Secondary | ICD-10-CM | POA: Diagnosis not present

## 2020-01-30 DIAGNOSIS — F17210 Nicotine dependence, cigarettes, uncomplicated: Secondary | ICD-10-CM | POA: Diagnosis not present

## 2020-01-30 DIAGNOSIS — Z8673 Personal history of transient ischemic attack (TIA), and cerebral infarction without residual deficits: Secondary | ICD-10-CM | POA: Diagnosis not present

## 2020-01-30 DIAGNOSIS — Z79899 Other long term (current) drug therapy: Secondary | ICD-10-CM | POA: Diagnosis not present

## 2020-01-30 DIAGNOSIS — D7589 Other specified diseases of blood and blood-forming organs: Secondary | ICD-10-CM | POA: Diagnosis not present

## 2020-01-30 DIAGNOSIS — N185 Chronic kidney disease, stage 5: Secondary | ICD-10-CM | POA: Diagnosis not present

## 2020-01-30 DIAGNOSIS — F209 Schizophrenia, unspecified: Secondary | ICD-10-CM | POA: Diagnosis not present

## 2020-01-30 DIAGNOSIS — R531 Weakness: Secondary | ICD-10-CM | POA: Diagnosis present

## 2020-01-30 DIAGNOSIS — M6281 Muscle weakness (generalized): Secondary | ICD-10-CM | POA: Diagnosis not present

## 2020-01-30 DIAGNOSIS — W010XXA Fall on same level from slipping, tripping and stumbling without subsequent striking against object, initial encounter: Secondary | ICD-10-CM | POA: Diagnosis present

## 2020-01-30 DIAGNOSIS — D510 Vitamin B12 deficiency anemia due to intrinsic factor deficiency: Secondary | ICD-10-CM | POA: Diagnosis not present

## 2020-01-30 DIAGNOSIS — I10 Essential (primary) hypertension: Secondary | ICD-10-CM | POA: Diagnosis not present

## 2020-01-30 LAB — CBC
HCT: 21 % — ABNORMAL LOW (ref 39.0–52.0)
HCT: 21.7 % — ABNORMAL LOW (ref 39.0–52.0)
Hemoglobin: 6.9 g/dL — CL (ref 13.0–17.0)
Hemoglobin: 7.2 g/dL — ABNORMAL LOW (ref 13.0–17.0)
MCH: 33.7 pg (ref 26.0–34.0)
MCH: 33.8 pg (ref 26.0–34.0)
MCHC: 32.9 g/dL (ref 30.0–36.0)
MCHC: 33.2 g/dL (ref 30.0–36.0)
MCV: 102.4 fL — ABNORMAL HIGH (ref 80.0–100.0)
MCV: 101.9 fL — ABNORMAL HIGH (ref 80.0–100.0)
Platelets: 141 10*3/uL — ABNORMAL LOW (ref 150–400)
Platelets: 139 10*3/uL — ABNORMAL LOW (ref 150–400)
RBC: 2.05 MIL/uL — ABNORMAL LOW (ref 4.22–5.81)
RBC: 2.13 MIL/uL — ABNORMAL LOW (ref 4.22–5.81)
RDW: 18.6 % — ABNORMAL HIGH (ref 11.5–15.5)
RDW: 18.8 % — ABNORMAL HIGH (ref 11.5–15.5)
WBC: 5.6 10*3/uL (ref 4.0–10.5)
WBC: 5.3 10*3/uL (ref 4.0–10.5)
nRBC: 0 % (ref 0.0–0.2)
nRBC: 0 % (ref 0.0–0.2)

## 2020-01-30 MED ORDER — CLONIDINE HCL 0.2 MG PO TABS
0.2000 mg | ORAL_TABLET | Freq: Three times a day (TID) | ORAL | Status: DC
  Administered 2020-01-30 – 2020-02-01 (×7): 0.2 mg via ORAL
  Filled 2020-01-30 (×7): qty 1

## 2020-01-30 MED ORDER — CYANOCOBALAMIN 1000 MCG/ML IJ SOLN
1000.0000 ug | Freq: Once | INTRAMUSCULAR | Status: AC
  Administered 2020-01-30: 1000 ug via INTRAMUSCULAR
  Filled 2020-01-30: qty 1

## 2020-01-30 MED ORDER — FOLIC ACID 1 MG PO TABS
1.0000 mg | ORAL_TABLET | Freq: Every day | ORAL | Status: DC
  Administered 2020-01-30 – 2020-02-01 (×3): 1 mg via ORAL
  Filled 2020-01-30 (×3): qty 1

## 2020-01-30 MED ORDER — HYDRALAZINE HCL 25 MG PO TABS
100.0000 mg | ORAL_TABLET | Freq: Three times a day (TID) | ORAL | Status: DC
  Administered 2020-01-30 – 2020-02-01 (×7): 100 mg via ORAL
  Filled 2020-01-30 (×7): qty 4

## 2020-01-30 NOTE — Progress Notes (Signed)
PROGRESS NOTE    Eddie [name]  AYT:[id number] DOB: 10-30-1950 DOA: 01/29/2020 PCP: Doree Albee, MD   Brief Narrative: Eddie [name] is a 69 y.o. male with a history of hypertension, chronic kidney disease stage V, carotid artery occlusio, and schizophrenia. 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 No LOC.  -PT eval  Symptomatic anemia Patient presented with a hemoglobin of 6.7. Associated macrocytosis with a folate of 5.2 and vitamin B12 of 99. Anemia appears acute as he had near normal hemoglobin of 11.2 in August of this year. No history of blood loss. History of colonoscopy but he has no details. Received 1 unit of PRBC and hemoglobin downtrended again -Folate 1 mg daily -Vitamin B12 1000 mcg IM x1 -Erythropoietin and intrinsic factor ab labs -Repeat CBC  CKD stage 5 Acutely worsened. Patient declining hemodialysis and understand risks/benefits. Currently DNR and declining hospice referral at this time.  Primary hypertension Patient is on amlodipine, clonidine and hydralazine as an outpatient. Blood pressure is uncontrolled here. On amlodipine from discharge. -Continue amlodipine and restart home clonidine and hydralazine  Schizophrenia On Invega depo as an outpatient   DVT prophylaxis: SCDs Code Status:   Code Status: DNR Family Communication: None at bedside Disposition Plan: Discharge home likely in 1-2 days pending stability of hemoglobin   Consultants:   None  Procedures:   None  Antimicrobials:  None    Subjective: No concerns today. No lightheadedness or dizziness. Has not gotten up this morning.  Objective: Vitals:   01/29/20 2055 01/29/20 2100 01/30/20 0108 01/30/20 0500  BP: (!) 159/73  (!) 160/79 (!) 162/68    Pulse: 86  83 82  Resp: 18  18 18   Temp: 98.2 F (36.8 C)  98.6 F (37 C) 98.3 F (36.8 C)  TempSrc: Oral  Oral   SpO2: 100%  100% 100%  Weight:  79.4 kg    Height:  6\' 2"  (1.88 m)      Intake/Output Summary (Last 24 hours) at 01/30/2020 0806 Last data filed at 01/30/2020 0430 Gross per 24 hour  Intake 565 ml  Output 1 ml  Net 564 ml   Filed Weights   01/29/20 1151 01/29/20 2100  Weight: 78 kg 79.4 kg    Examination:  General exam: Appears calm and comfortable Respiratory system: Clear to auscultation. Respiratory effort normal. Cardiovascular system: S1 & S2 heard, RRR. No murmurs, rubs, gallops or clicks. Gastrointestinal system: Abdomen is nondistended, soft and nontender. No organomegaly or masses felt. Normal bowel sounds heard. Central nervous system: Alert and oriented. No focal neurological deficits. Musculoskeletal: No edema. No calf tenderness Skin: No cyanosis. No rashes Psychiatry: Judgement and insight appear normal. Mood & affect appropriate.     Data Reviewed: I have personally reviewed following labs and imaging studies  CBC Lab Results  Component Value Date   WBC 5.6 01/30/2020   RBC 2.05 (L) 01/30/2020   HGB 6.9 (LL) 01/30/2020   HCT 21.0 (L) 01/30/2020   MCV 102.4 (H) 01/30/2020   MCH 33.7 01/30/2020   PLT 141 (L) 01/30/2020   MCHC 32.9 01/30/2020   RDW 18.6 (H) 01/30/2020   LYMPHSABS 0.9 01/29/2020   MONOABS 0.3 01/29/2020   EOSABS 0.1 01/29/2020   BASOSABS 0.0 01/29/2020  Last metabolic panel Lab Results  Component Value Date   NA 129 (L) 01/29/2020   K 4.8 01/29/2020   CL 99 01/29/2020   CO2 17 (L) 01/29/2020   BUN 91 (H) 01/29/2020   CREATININE 13.90 (H) 01/29/2020   GLUCOSE 121 (H) 01/29/2020   GFRNONAA 4 (L) 01/29/2020   GFRAA 23 (L) 11/18/2019   CALCIUM 8.1 (L) 01/29/2020   PHOS 6.0 (H) 01/14/2020   PROT 7.0 01/29/2020   ALBUMIN 3.6 01/29/2020   BILITOT 0.8 01/29/2020   ALKPHOS 80 01/29/2020   AST 14 (L)  01/29/2020   ALT 11 01/29/2020   ANIONGAP 13 01/29/2020    CBG (last 3)  No results for input(s): GLUCAP in the last 72 hours.   GFR: Estimated Creatinine Clearance: 5.8 mL/min (A) (by C-G formula based on SCr of 13.9 mg/dL (H)).  Coagulation Profile: No results for input(s): INR, PROTIME in the last 168 hours.  Recent Results (from the past 240 hour(s))  Respiratory Panel by RT PCR (Flu A&B, Covid) - Nasopharyngeal Swab     Status: None   Collection Time: 01/29/20  3:21 PM   Specimen: Nasopharyngeal Swab  Result Value Ref Range Status   SARS Coronavirus 2 by RT PCR NEGATIVE NEGATIVE Final    Comment: (NOTE) SARS-CoV-2 target nucleic acids are NOT DETECTED.  The SARS-CoV-2 RNA is generally detectable in upper respiratoy specimens during the acute phase of infection. The lowest concentration of SARS-CoV-2 viral copies this assay can detect is 131 copies/mL. A negative result does not preclude SARS-Cov-2 infection and should not be used as the sole basis for treatment or other patient management decisions. A negative result may occur with  improper specimen collection/handling, submission of specimen other than nasopharyngeal swab, presence of viral mutation(s) within the areas targeted by this assay, and inadequate number of viral copies (<131 copies/mL). A negative result must be combined with clinical observations, patient history, and epidemiological information. The expected result is Negative.  Fact Sheet for Patients:  PinkCheek.be  Fact Sheet for Healthcare Providers:  GravelBags.it  This test is no t yet approved or cleared by the [place] FDA and  has been authorized for detection and/or diagnosis of SARS-CoV-2 by FDA under an Emergency Use Authorization (EUA). This EUA will remain  in effect (meaning this test can be used) for the duration of the COVID-19 declaration under Section 564(b)(1) of the  Act, 21 U.S.C. section 360bbb-3(b)(1), unless the authorization is terminated or revoked sooner.     Influenza A by PCR NEGATIVE NEGATIVE Final   Influenza B by PCR NEGATIVE NEGATIVE Final    Comment: (NOTE) The Xpert Xpress SARS-CoV-2/FLU/RSV assay is intended as an aid in  the diagnosis of influenza from Nasopharyngeal swab specimens and  should not be used as a sole basis for treatment. Nasal washings and  aspirates are unacceptable for Xpert Xpress SARS-CoV-2/FLU/RSV  testing.  Fact Sheet for Patients: PinkCheek.be  Fact Sheet for Healthcare Providers: GravelBags.it  This test is not yet approved or cleared by the [place] FDA and  has been authorized for detection and/or diagnosis of SARS-CoV-2 by  FDA under an Emergency Use Authorization (EUA). This EUA will remain  in effect (meaning this test can be used) for the duration of the  Covid-19 declaration under Section 564(b)(1) of the Act, 21  U.S.C. section 360bbb-3(b)(1), unless the authorization is  terminated or revoked. Performed at [facility], [address], [place], [place] [address]  Radiology Studies: DG Chest Port 1 View  Result Date: 01/29/2020 CLINICAL DATA:  Fall, weakness. EXAM: PORTABLE CHEST 1 VIEW COMPARISON:  None. FINDINGS: Similar cardiomediastinal silhouette. Both lungs are clear. No visible pleural effusions or pneumothorax. The visualized skeletal structures are unremarkable. IMPRESSION: No acute cardiopulmonary disease. Electronically Signed   By: Margaretha Sheffield MD   On: 01/29/2020 12:41        Scheduled Meds: . amLODipine  10 mg Oral Daily  . cyanocobalamin  1,000 mcg Intramuscular Once  . feeding supplement (NEPRO CARB STEADY)  237 mL Oral BID BM  . folic acid  1 mg Oral Daily   Continuous Infusions:   LOS: 0 days     Cordelia Poche, MD [facility] 01/30/2020, 8:06 AM  If 7PM-7AM, please contact  night-coverage [email or web address]

## 2020-01-31 DIAGNOSIS — E538 Deficiency of other specified B group vitamins: Secondary | ICD-10-CM

## 2020-01-31 DIAGNOSIS — D510 Vitamin B12 deficiency anemia due to intrinsic factor deficiency: Secondary | ICD-10-CM | POA: Diagnosis not present

## 2020-01-31 DIAGNOSIS — I10 Essential (primary) hypertension: Secondary | ICD-10-CM | POA: Diagnosis not present

## 2020-01-31 DIAGNOSIS — R531 Weakness: Secondary | ICD-10-CM | POA: Diagnosis not present

## 2020-01-31 DIAGNOSIS — D649 Anemia, unspecified: Secondary | ICD-10-CM | POA: Diagnosis not present

## 2020-01-31 DIAGNOSIS — I120 Hypertensive chronic kidney disease with stage 5 chronic kidney disease or end stage renal disease: Secondary | ICD-10-CM | POA: Diagnosis not present

## 2020-01-31 LAB — CBC
HCT: 20.3 % — ABNORMAL LOW (ref 39.0–52.0)
Hemoglobin: 6.7 g/dL — CL (ref 13.0–17.0)
MCH: 33.5 pg (ref 26.0–34.0)
MCHC: 33 g/dL (ref 30.0–36.0)
MCV: 101.5 fL — ABNORMAL HIGH (ref 80.0–100.0)
Platelets: 144 10*3/uL — ABNORMAL LOW (ref 150–400)
RBC: 2 MIL/uL — ABNORMAL LOW (ref 4.22–5.81)
RDW: 18.6 % — ABNORMAL HIGH (ref 11.5–15.5)
WBC: 5.2 10*3/uL (ref 4.0–10.5)
nRBC: 0.4 % — ABNORMAL HIGH (ref 0.0–0.2)

## 2020-01-31 LAB — PREPARE RBC (CROSSMATCH)

## 2020-01-31 LAB — ERYTHROPOIETIN: Erythropoietin: 6.5 m[IU]/mL (ref 2.6–18.5)

## 2020-01-31 MED ORDER — SODIUM CHLORIDE 0.9% IV SOLUTION: Freq: Once | INTRAVENOUS | Status: AC

## 2020-01-31 MED ORDER — FUROSEMIDE 10 MG/ML IJ SOLN
20.0000 mg | Freq: Once | INTRAMUSCULAR | Status: AC
  Administered 2020-01-31: 20 mg via INTRAVENOUS
  Filled 2020-01-31: qty 2

## 2020-01-31 NOTE — TOC Initial Note (Addendum)
Transition of Care [facility]) - Initial/Assessment Note    Patient Details  Name: Eddie [name] MRN: [id number] Date of Birth: 08/24/1950  [facility]) CM/SW Contact:    Natasha Bence, LCSW Phone Number: 01/31/2020, 1:14 PM  Clinical Narrative:                 Patient is a 69 year old male admitted for Symptomatic anemia. CSW spoke with patient's family who reported that patient previously had a stroke and has had difficulty ambulating and performing some ADL's such as cooking. Patient's family requested HHPT, RN and aid. Patient's family reported that they have previously attempted to get [facility] with their PCP but wasn't successful. Family reported that they have been providing regular care for the patient, but would like assistance with helping patient regain function. CSW place referral with Corene Cornea with [facility] pending PT Eval.   Expected Discharge Plan: [place] Barriers to Discharge: Continued Medical Work up   Patient Goals and CMS Choice Patient states their goals for this hospitalization and ongoing recovery are:: Home with [facility]   Choice offered to / list presented to : Adult Children  Expected Discharge Plan and Services Expected Discharge Plan: [place]       Living arrangements for the past 2 months: [place]: PT [place] Agency: [place] ([place]) Date [place]: 01/31/20 Time [place]: 1307 Representative spoke with at [place]: [place] Arrangements/Services Living arrangements for the past 2 months: [place] Lives with:: Adult Children Patient language and need for interpreter reviewed:: Yes Do you feel safe going back to the place where you live?: Yes      Need for Family Participation in Patient Care: Yes (Comment) Care giver support system in place?: Yes (comment)   Criminal Activity/Legal Involvement Pertinent  to Current Situation/Hospitalization: No - Comment as needed  Activities of Daily Living Home Assistive Devices/Equipment: None ADL Screening (condition at time of admission) Patient's cognitive ability adequate to safely complete daily activities?: Yes Is the patient deaf or have difficulty hearing?: No Does the patient have difficulty seeing, even when wearing glasses/contacts?: No Does the patient have difficulty concentrating, remembering, or making decisions?: No Patient able to express need for assistance with ADLs?: Yes Does the patient have difficulty dressing or bathing?: No Independently performs ADLs?: Yes (appropriate for developmental age) Does the patient have difficulty walking or climbing stairs?: Yes Weakness of Legs: Both Weakness of Arms/Hands: None  Permission Sought/Granted Permission sought to share information with : Family Supports Permission granted to share information with : Yes, Verbal Permission Granted  Share Information with NAME: Cizek,alphany  Permission granted to share info w AGENCY: [facility]  Permission granted to share info w Relationship: Nephew  Permission granted to share info w Contact Information: [phone]  Emotional Assessment       Orientation: : Oriented to Self, Oriented to Place, Oriented to  Time, Oriented to Situation Alcohol / Substance Use: Not Applicable Psych Involvement: No (comment)  Admission diagnosis:  Fall, initial encounter [W19.XXXA] Symptomatic anemia [D64.9] Stage 5 chronic kidney disease not on chronic dialysis [facility]) [N18.5] Patient Active Problem List   Diagnosis Date Noted  . Vitamin B12 deficiency 01/30/2020  . Folate deficiency 01/30/2020  . Essential hypertension 01/29/2020  .  CKD stage 5 secondary to hypertension ([place]) 01/29/2020  . Symptomatic anemia 01/29/2020  . 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 Ankle fracture, lateral malleolus, closed 10/22/2011  . ALCOHOL USE 05/17/2008  . HEMATOCHEZIA 05/17/2008   . UTI 05/17/2008   PCP:  Doree Albee, MD Pharmacy:   [facility] [place] [address] [place] [address] Phone: [phone] Fax: [phone]     Social Determinants of Health (SDOH) Interventions    Readmission Risk Interventions No flowsheet data found.

## 2020-01-31 NOTE — Progress Notes (Signed)
PROGRESS NOTE    Eddie [name]  XTK:[id number] DOB: 1951-01-10 DOA: 01/29/2020 PCP: Doree Albee, MD   Brief Narrative: Eddie [name] is a 69 y.o. male with a history of hypertension, chronic kidney disease stage V, carotid artery occlusio, and schizophrenia. 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 No LOC.  -PT eval  Symptomatic anemia Acute anemia Patient presented with a hemoglobin of 6.7. Associated macrocytosis with a folate of 5.2 and vitamin B12 of 99. Anemia appears acute as he had near normal hemoglobin of 11.2 in August of this year. No history of blood loss. History of colonoscopy but he has no details. Received 1 unit of PRBC and hemoglobin downtrended again. Erythropoietin and intrinsic factor ab labs pending -FOBT -1 unit of PRBC followed by Lasix 20 mg IV x1 -CBC in AM  Vitamin B12/Folate deficiency Patient given one dose of Vitamin B12 1000 mcg IM. Started on folic acid 1 mg daily -Continue folic acid  CKD stage 5 Acutely worsened. Patient declining hemodialysis and understand risks/benefits. Currently DNR and declining hospice referral at this time.  Primary hypertension Patient is on amlodipine, clonidine and hydralazine as an outpatient. Blood pressure is uncontrolled here. On amlodipine from discharge. -Continue amlodipine and restart home clonidine and hydralazine  Schizophrenia On Invega depo as an outpatient   DVT prophylaxis: SCDs Code Status:   Code Status: DNR Family Communication: None at bedside Disposition Plan: Discharge home likely in 1-2 days pending stability of hemoglobin   Consultants:   None  Procedures:   None  Antimicrobials:  None     Subjective: No issues overnight.  Objective: Vitals:   01/30/20 0900 01/30/20 1600 01/30/20 2126 01/31/20 0459  BP: 133/68 (!) 146/77 132/73 129/71  Pulse: 88 85 72 67  Resp: 17 18 18 16   Temp: 98.2 F (36.8 C) 99 F (37.2 C) 97.9 F (36.6 C) 97.6 F (36.4 C)  TempSrc: Oral Oral Oral Oral  SpO2: 96% 100% 100% 100%  Weight:      Height:        Intake/Output Summary (Last 24 hours) at 01/31/2020 0756 Last data filed at 01/30/2020 1300 Gross per 24 hour  Intake 720 ml  Output --  Net 720 ml   Filed Weights   01/29/20 1151 01/29/20 2100  Weight: 78 kg 79.4 kg    Examination:  General exam: Appears calm and comfortable Respiratory system: Clear to auscultation. Respiratory effort normal. Cardiovascular system: S1 & S2 heard, RRR. No murmurs, rubs, gallops or clicks. Gastrointestinal system: Abdomen is nondistended, soft and nontender. No organomegaly or masses felt. Normal bowel sounds heard. Central nervous system: Alert and oriented. No focal neurological deficits. Musculoskeletal: No edema. No calf tenderness Skin: No cyanosis. No rashes Psychiatry: Judgement and insight appear normal. Mood & affect appropriate.      Data Reviewed: I have personally reviewed following labs and imaging studies  CBC Lab Results  Component Value Date   WBC 5.2 01/31/2020   RBC 2.00 (L) 01/31/2020   HGB 6.7 (LL) 01/31/2020   HCT 20.3 (L) 01/31/2020   MCV 101.5 (H) 01/31/2020   MCH 33.5 01/31/2020   PLT 144 (L) 01/31/2020   MCHC 33.0 01/31/2020  RDW 18.6 (H) 01/31/2020   LYMPHSABS 0.9 01/29/2020   MONOABS 0.3 01/29/2020   EOSABS 0.1 01/29/2020   BASOSABS 0.0 09/73/5329     Last metabolic panel Lab Results  Component Value Date   NA 129 (L) 01/29/2020   K 4.8 01/29/2020   CL 99 01/29/2020   CO2 17 (L) 01/29/2020   BUN 91 (H) 01/29/2020   CREATININE 13.90 (H) 01/29/2020   GLUCOSE 121 (H) 01/29/2020   GFRNONAA 4 (L) 01/29/2020   GFRAA 23 (L) 11/18/2019    CALCIUM 8.1 (L) 01/29/2020   PHOS 6.0 (H) 01/14/2020   PROT 7.0 01/29/2020   ALBUMIN 3.6 01/29/2020   BILITOT 0.8 01/29/2020   ALKPHOS 80 01/29/2020   AST 14 (L) 01/29/2020   ALT 11 01/29/2020   ANIONGAP 13 01/29/2020    CBG (last 3)  No results for input(s): GLUCAP in the last 72 hours.   GFR: Estimated Creatinine Clearance: 5.8 mL/min (A) (by C-G formula based on SCr of 13.9 mg/dL (H)).  Coagulation Profile: No results for input(s): INR, PROTIME in the last 168 hours.  Recent Results (from the past 240 hour(s))  Respiratory Panel by RT PCR (Flu A&B, Covid) - Nasopharyngeal Swab     Status: None   Collection Time: 01/29/20  3:21 PM   Specimen: Nasopharyngeal Swab  Result Value Ref Range Status   SARS Coronavirus 2 by RT PCR NEGATIVE NEGATIVE Final    Comment: (NOTE) SARS-CoV-2 target nucleic acids are NOT DETECTED.  The SARS-CoV-2 RNA is generally detectable in upper respiratoy specimens during the acute phase of infection. The lowest concentration of SARS-CoV-2 viral copies this assay can detect is 131 copies/mL. A negative result does not preclude SARS-Cov-2 infection and should not be used as the sole basis for treatment or other patient management decisions. A negative result may occur with  improper specimen collection/handling, submission of specimen other than nasopharyngeal swab, presence of viral mutation(s) within the areas targeted by this assay, and inadequate number of viral copies (<131 copies/mL). A negative result must be combined with clinical observations, patient history, and epidemiological information. The expected result is Negative.  Fact Sheet for Patients:  PinkCheek.be  Fact Sheet for Healthcare Providers:  GravelBags.it  This test is no t yet approved or cleared by the [place] FDA and  has been authorized for detection and/or diagnosis of SARS-CoV-2 by FDA under an Emergency  Use Authorization (EUA). This EUA will remain  in effect (meaning this test can be used) for the duration of the COVID-19 declaration under Section 564(b)(1) of the Act, 21 U.S.C. section 360bbb-3(b)(1), unless the authorization is terminated or revoked sooner.     Influenza A by PCR NEGATIVE NEGATIVE Final   Influenza B by PCR NEGATIVE NEGATIVE Final    Comment: (NOTE) The Xpert Xpress SARS-CoV-2/FLU/RSV assay is intended as an aid in  the diagnosis of influenza from Nasopharyngeal swab specimens and  should not be used as a sole basis for treatment. Nasal washings and  aspirates are unacceptable for Xpert Xpress SARS-CoV-2/FLU/RSV  testing.  Fact Sheet for Patients: PinkCheek.be  Fact Sheet for Healthcare Providers: GravelBags.it  This test is not yet approved or cleared by the [place] FDA and  has been authorized for detection and/or diagnosis of SARS-CoV-2 by  FDA under an Emergency Use Authorization (EUA). This EUA will remain  in effect (meaning this test can be used) for the duration of the  Covid-19 declaration under Section 564(b)(1) of the Act, 21  U.S.C. section 360bbb-3(b)(1), unless the authorization is  terminated or revoked. Performed at [facility], [address] [place], [place] [address]         Radiology Studies: [facility] Chest [facility] 1 View  Result Date: 01/29/2020 CLINICAL DATA:  Fall, weakness. EXAM: PORTABLE CHEST 1 VIEW COMPARISON:  None. FINDINGS: Similar cardiomediastinal silhouette. Both lungs are clear. No visible pleural effusions or pneumothorax. The visualized skeletal structures are unremarkable. IMPRESSION: No acute cardiopulmonary disease. Electronically Signed   By: Margaretha Sheffield MD   On: 01/29/2020 12:41        Scheduled Meds: . amLODipine  10 mg Oral Daily  . cloNIDine  0.2 mg Oral TID  . feeding supplement (NEPRO CARB STEADY)  237 mL Oral BID BM  . folic acid  1 mg  Oral Daily  . hydrALAZINE  100 mg Oral TID   Continuous Infusions:   LOS: 1 day     Cordelia Poche, MD [facility] 01/31/2020, 7:56 AM  If 7PM-7AM, please contact night-coverage [email or web address]

## 2020-01-31 NOTE — Discharge Summary (Addendum)
Physician Discharge Summary  Eddie [name] UXN:[id number] DOB: 22-Nov-1950 DOA: 01/29/2020  PCP: Doree Albee, MD  Admit date: 01/29/2020 Discharge date: 02/01/2020  Admitted From: Home Disposition: Home  Recommendations for Outpatient Follow-up:  1. Follow up with PCP in 1 week 2. Please obtain BMP/CBC in one week 3. Please follow up on the following pending results: None  Home Health: PT/OT Equipment/Devices: Rolling walker  Discharge Condition: Guarded CODE STATUS: DNR Diet recommendation: Renal diet   Brief/Interim Summary:  Admission HPI written by Albertine Patricia, MD    HPI:   Eddie [name]  is a 69 y.o. male, with past medical history of hypertension, chronic kidney disease stage V (followed by Dr. Theador Hawthorne, has been refusing hemodialysis), carotid artery occlusion(no surgical recommendation by vascular surgery), and schizophrenia on Invega. -Patient presents to ED secondary to fall, patient report he did tripped, lost his balance, he denies any dizziness, lightheadedness, loss of consciousness, reports he is not on any blood thinners, he denies any pain currently, patient lives with his nephew who brought him to ED for evaluation, his work-up was significant for anemia with hemoglobin of 6.7, he is Hemoccult negative, he denies any times of GI bleed, no melena, no coffee-ground emesis, no bright red blood per rectum, with known history of CKD stage V, he is followed by renal Dr. Theador Hawthorne, he has been refusing hemodialysis despite started having some uremic symptoms, overall he has been more weak, frail with poor appetite, he denies any fever, chills, chest pain or shortness of breath he denies any fever, chills, chest pain. - in ED HGB is 6.7, hemoccult negative, no bruising or hematoma at fall site, BP elevated at 169/79, he is ordered 1 unit PRBC, [facility] consulted to admit.   Hospital course:  Fall Appears patient tripped and fell. No LOC. PT/OT  recommending HH PT/OT  Symptomatic anemia Acute anemia Pernicious anemia Patient presented with a hemoglobin of 6.7. Associated macrocytosis with a folate of 5.2 and vitamin B12 of 99. Anemia appears acute as he had near normal hemoglobin of 11.2 in August of this year. No history of blood loss. History of colonoscopy but he has no details. Received 1 unit of PRBC and hemoglobin downtrended again. Erythropoietin normal with elevated intrinsic factor ab. FOBT ordered but none obtained. Patient appears to have pernicious anemia. Recommend outpatient workup and Vitamin B12 injections from PCP. Discharge with folic acid  Vitamin U54/YHCWCB deficiency Patient given one dose of Vitamin B12 1000 mcg IM. Started on folic acid 1 mg daily. Vitamin B12 injections as an outpatient by PCP.  CKD stage 5 Acutely worsened. Patient declining hemodialysis and understand risks/benefits. Currently DNR and declining hospice referral at this time. Outpatient follow-up.  Primary hypertension Patient is on amlodipine, clonidine and hydralazine as an outpatient. Continue.  Schizophrenia On Invega depo as an outpatient  Discharge Diagnoses:  Principal Problem:   Symptomatic anemia Active Problems:   Essential hypertension   CKD stage 5 secondary to hypertension (HCC)   Vitamin B12 deficiency   Folate deficiency   Pernicious anemia    Discharge Instructions  Discharge Instructions    Diet - low sodium heart healthy   Complete by: As directed    Increase activity slowly   Complete by: As directed      Allergies as of 02/01/2020   No Known Allergies     Medication List    TAKE these medications   amLODipine 10 MG tablet Commonly known as: NORVASC Take  1 tablet (10 mg total) by mouth daily.   cloNIDine 0.1 MG tablet Commonly known as: CATAPRES Take 2 tablets by mouth 3 (three) times daily.   ergocalciferol 1.25 MG (50000 UT) capsule Commonly known as: VITAMIN D2 Take 1 capsule by mouth  once a week.   feeding supplement (NEPRO CARB STEADY) Liqd Take 237 mLs by mouth 2 (two) times daily between meals.   folic acid 1 MG tablet Commonly known as: FOLVITE Take 1 tablet (1 mg total) by mouth daily. Start taking on: February 02, 2020   hydrALAZINE 100 MG tablet Commonly known as: APRESOLINE Take 100 mg by mouth 3 (three) times daily.   paliperidone 156 MG/ML Susy injection Commonly known as: INVEGA SUSTENNA Inject 156 mg into the muscle every 30 (thirty) days.            Durable Medical Equipment  (From admission, onward)         Start     Ordered   02/01/20 1104  For home use only DME Walker rolling  Once       Question Answer Comment  Walker: With 5 Inch Wheels   Patient needs a walker to treat with the following condition Symptomatic anemia   Patient needs a walker to treat with the following condition Pernicious anemia      02/01/20 1104          Follow-up Information    Doree Albee, MD. Go on 02/08/2020.   Specialty: Internal Medicine Why: B-12 injection and hospital follow-up 10:00am Contact information: [place] [address] [place] Follow up.   Specialty: Home Health Services Why: PT and OT             No Known Allergies  Consultations:  None   Procedures/Studies: DG Chest Port 1 View  Result Date: 01/29/2020 CLINICAL DATA:  Fall, weakness. EXAM: PORTABLE CHEST 1 VIEW COMPARISON:  None. FINDINGS: Similar cardiomediastinal silhouette. Both lungs are clear. No visible pleural effusions or pneumothorax. The visualized skeletal structures are unremarkable. IMPRESSION: No acute cardiopulmonary disease. Electronically Signed   By: Margaretha Sheffield MD   On: 01/29/2020 12:41      Subjective: No issues overnight.  Discharge Exam: Vitals:   02/01/20 0519 02/01/20 0900  BP: 126/70 120/68  Pulse: 67 68  Resp: 18   Temp: (!) 97.5 F (36.4 C)   SpO2: 99%     Vitals:   01/31/20 1330 01/31/20 2050 02/01/20 0519 02/01/20 0900  BP: 124/65 136/79 126/70 120/68  Pulse: 74 68 67 68  Resp: 18 18 18    Temp: 97.7 F (36.5 C) 97.6 F (36.4 C) (!) 97.5 F (36.4 C)   TempSrc: Oral Oral Oral   SpO2: 99% 100% 99%   Weight:      Height:        General: Pt is alert, awake, not in acute distress Cardiovascular: RRR, S1/S2 +, no rubs, no gallops Respiratory: CTA bilaterally, no wheezing, no rhonchi Abdominal: Soft, NT, ND, bowel sounds + Extremities: no edema, no cyanosis    The results of significant diagnostics from this hospitalization (including imaging, microbiology, ancillary and laboratory) are listed below for reference.     Microbiology: Recent Results (from the past 240 hour(s))  Respiratory Panel by RT PCR (Flu A&B, Covid) - Nasopharyngeal Swab     Status: None   Collection Time: 01/29/20  3:21 PM   Specimen: Nasopharyngeal Swab  Result Value Ref Range Status   SARS Coronavirus 2 by RT PCR NEGATIVE NEGATIVE Final    Comment: (NOTE) SARS-CoV-2 target nucleic acids are NOT DETECTED.  The SARS-CoV-2 RNA is generally detectable in upper respiratoy specimens during the acute phase of infection. The lowest concentration of SARS-CoV-2 viral copies this assay can detect is 131 copies/mL. A negative result does not preclude SARS-Cov-2 infection and should not be used as the sole basis for treatment or other patient management decisions. A negative result may occur with  improper specimen collection/handling, submission of specimen other than nasopharyngeal swab, presence of viral mutation(s) within the areas targeted by this assay, and inadequate number of viral copies (<131 copies/mL). A negative result must be combined with clinical observations, patient history, and epidemiological information. The expected result is Negative.  Fact Sheet for Patients:  PinkCheek.be  Fact Sheet for Healthcare  Providers:  GravelBags.it  This test is no t yet approved or cleared by the [place] FDA and  has been authorized for detection and/or diagnosis of SARS-CoV-2 by FDA under an Emergency Use Authorization (EUA). This EUA will remain  in effect (meaning this test can be used) for the duration of the COVID-19 declaration under Section 564(b)(1) of the Act, 21 U.S.C. section 360bbb-3(b)(1), unless the authorization is terminated or revoked sooner.     Influenza A by PCR NEGATIVE NEGATIVE Final   Influenza B by PCR NEGATIVE NEGATIVE Final    Comment: (NOTE) The Xpert Xpress SARS-CoV-2/FLU/RSV assay is intended as an aid in  the diagnosis of influenza from Nasopharyngeal swab specimens and  should not be used as a sole basis for treatment. Nasal washings and  aspirates are unacceptable for Xpert Xpress SARS-CoV-2/FLU/RSV  testing.  Fact Sheet for Patients: PinkCheek.be  Fact Sheet for Healthcare Providers: GravelBags.it  This test is not yet approved or cleared by the [place] FDA and  has been authorized for detection and/or diagnosis of SARS-CoV-2 by  FDA under an Emergency Use Authorization (EUA). This EUA will remain  in effect (meaning this test can be used) for the duration of the  Covid-19 declaration under Section 564(b)(1) of the Act, 21  U.S.C. section 360bbb-3(b)(1), unless the authorization is  terminated or revoked. Performed at [facility], [address] [place], [place] [address]      Labs: BNP (last 3 results) No results for input(s): BNP in the last 8760 hours. Basic Metabolic Panel: Recent Labs  Lab 01/29/20 1232  NA 129*  K 4.8  CL 99  CO2 17*  GLUCOSE 121*  BUN 91*  CREATININE 13.90*  CALCIUM 8.1*   Liver Function Tests: Recent Labs  Lab 01/29/20 1232  AST 14*  ALT 11  ALKPHOS 80  BILITOT 0.8  PROT 7.0  ALBUMIN 3.6   No results for input(s):  LIPASE, AMYLASE in the last 168 hours. No results for input(s): AMMONIA in the last 168 hours. CBC: Recent Labs  Lab 01/29/20 1232 01/29/20 1232 01/29/20 2220 01/30/20 0547 01/30/20 1007 01/31/20 0728 02/01/20 0421  WBC 4.5   < > 5.9 5.6 5.3 5.2 6.1  NEUTROABS 3.2  --   --   --   --   --   --   HGB 6.7*   < > 7.2* 6.9* 7.2* 6.7* 7.6*  HCT 20.9*   < > 21.6* 21.0* 21.7* 20.3* 23.8*  MCV 106.6*   < > 102.4* 102.4* 101.9* 101.5* 100.0  PLT 137*   < > 137* 141* 139* 144* 140*   < > =  values in this interval not displayed.   Cardiac Enzymes: No results for input(s): CKTOTAL, CKMB, CKMBINDEX, TROPONINI in the last 168 hours. BNP: Invalid input(s): POCBNP CBG: No results for input(s): GLUCAP in the last 168 hours. D-Dimer No results for input(s): DDIMER in the last 72 hours. Hgb A1c No results for input(s): HGBA1C in the last 72 hours. Lipid Profile No results for input(s): CHOL, HDL, LDLCALC, TRIG, CHOLHDL, LDLDIRECT in the last 72 hours. Thyroid function studies No results for input(s): TSH, T4TOTAL, T3FREE, THYROIDAB in the last 72 hours.  Invalid input(s): FREET3 Anemia work up Recent Labs    01/29/20 1607  VITAMINB12 99*  FOLATE 5.2*  FERRITIN 639*  TIBC 201*  IRON 49  RETICCTPCT 2.9   Urinalysis    Component Value Date/Time   COLORURINE YELLOW 01/29/2020 1222   APPEARANCEUR HAZY (A) 01/29/2020 1222   LABSPEC 1.012 01/29/2020 1222   PHURINE 6.0 01/29/2020 1222   GLUCOSEU NEGATIVE 01/29/2020 1222   HGBUR SMALL (A) 01/29/2020 1222   BILIRUBINUR NEGATIVE 01/29/2020 1222   KETONESUR 5 (A) 01/29/2020 1222   PROTEINUR >=300 (A) 01/29/2020 1222   UROBILINOGEN 1.0 01/27/2008 1815   NITRITE NEGATIVE 01/29/2020 1222   LEUKOCYTESUR SMALL (A) 01/29/2020 1222   Sepsis Labs Invalid input(s): PROCALCITONIN,  WBC,  LACTICIDVEN Microbiology Recent Results (from the past 240 hour(s))  Respiratory Panel by RT PCR (Flu A&B, Covid) - Nasopharyngeal Swab     Status: None    Collection Time: 01/29/20  3:21 PM   Specimen: Nasopharyngeal Swab  Result Value Ref Range Status   SARS Coronavirus 2 by RT PCR NEGATIVE NEGATIVE Final    Comment: (NOTE) SARS-CoV-2 target nucleic acids are NOT DETECTED.  The SARS-CoV-2 RNA is generally detectable in upper respiratoy specimens during the acute phase of infection. The lowest concentration of SARS-CoV-2 viral copies this assay can detect is 131 copies/mL. A negative result does not preclude SARS-Cov-2 infection and should not be used as the sole basis for treatment or other patient management decisions. A negative result may occur with  improper specimen collection/handling, submission of specimen other than nasopharyngeal swab, presence of viral mutation(s) within the areas targeted by this assay, and inadequate number of viral copies (<131 copies/mL). A negative result must be combined with clinical observations, patient history, and epidemiological information. The expected result is Negative.  Fact Sheet for Patients:  PinkCheek.be  Fact Sheet for Healthcare Providers:  GravelBags.it  This test is no t yet approved or cleared by the [place] FDA and  has been authorized for detection and/or diagnosis of SARS-CoV-2 by FDA under an Emergency Use Authorization (EUA). This EUA will remain  in effect (meaning this test can be used) for the duration of the COVID-19 declaration under Section 564(b)(1) of the Act, 21 U.S.C. section 360bbb-3(b)(1), unless the authorization is terminated or revoked sooner.     Influenza A by PCR NEGATIVE NEGATIVE Final   Influenza B by PCR NEGATIVE NEGATIVE Final    Comment: (NOTE) The Xpert Xpress SARS-CoV-2/FLU/RSV assay is intended as an aid in  the diagnosis of influenza from Nasopharyngeal swab specimens and  should not be used as a sole basis for treatment. Nasal washings and  aspirates are unacceptable for Xpert  Xpress SARS-CoV-2/FLU/RSV  testing.  Fact Sheet for Patients: PinkCheek.be  Fact Sheet for Healthcare Providers: GravelBags.it  This test is not yet approved or cleared by the [place] FDA and  has been authorized for detection and/or diagnosis of SARS-CoV-2 by  FDA under an Emergency Use Authorization (EUA). This EUA will remain  in effect (meaning this test can be used) for the duration of the  Covid-19 declaration under Section 564(b)(1) of the Act, 21  U.S.C. section 360bbb-3(b)(1), unless the authorization is  terminated or revoked. Performed at [facility], [address], [place], [place] [address]      Time coordinating discharge: 35 mintues  SIGNED:   Cordelia Poche, MD [facility] 02/01/2020, 12:02 PM

## 2020-01-31 NOTE — Progress Notes (Signed)
Initial Nutrition Assessment  DOCUMENTATION CODES:   Not applicable  INTERVENTION:  Continue Nepro Shake po BID, each supplement provides 425 kcal and 19 grams protein  "Phosphorus Content of Foods" handout attached to d/c instructions  NUTRITION DIAGNOSIS:   Increased nutrient needs related to chronic illness (ESRD) as evidenced by estimated needs.    GOAL:   Patient will meet greater than or equal to 90% of their needs    MONITOR:   Labs, I & O's, Supplement acceptance, PO intake, Weight trends  REASON FOR ASSESSMENT:   Malnutrition Screening Tool, Consult Assessment of nutrition requirement/status  ASSESSMENT:  RD working remotely.  69 year old male with history of HTN, CKD stage V refusing dialysis, carotid artery occlusion, stroke, and schizophrenia presents after a fall and reports weakness and poor appetite. Pt found to have Hgb 6.7 and admitted with symptomatic anemia  RD attempted to contact pt via phone this morning, however no answer and unable to obtain nutrition history. Per chart, he is eating well, meal completion 90-100% and drinking Nepro supplements BID. His weights have trended down ~8 lbs (4.2%) in the last 2 months; insignificant however concerning given ESRD. Will plan to complete exam at follow-up. Per notes, he continues to decline hemodialysis despite acute worsening of ESRD. Noted hyperphosphatemia per labs, RD has attached "Phosphorus Content of Foods" handout from [employer] to discharge instructions.    Per notes: -downtrend of Hgb s/p 1 unit PRBC -erythropoietin and intrinsic factor labs pending -1 unit PRBC followed by Lasix 20 mg IV x 1, CBC in AM -currently DNR, declining hospice referral at this time  I/Os: +1284 ml since admit Medications reviewed and include: Folic acid, Lasix IV 20 mg once, Catapres  Labs: Na 129 (L), BUN 91 (H), Cr 13.9 (H), P 6.0 (H), Hgb 6.7 (L)  NUTRITION - FOCUSED PHYSICAL  EXAM: Unable to complete at this time, RD working remotely.   Diet Order:   Diet Order            Diet renal with fluid restriction Fluid restriction: 1200 mL Fluid; Room service appropriate? Yes; Fluid consistency: Thin  Diet effective now                 EDUCATION NEEDS:   Education needs have been addressed  Skin:     Last BM:     Height:   Ht Readings from Last 1 Encounters:  01/29/20 6\' 2"  (1.88 m)    Weight:   Wt Readings from Last 1 Encounters:  01/29/20 79.4 kg    BMI:  Body mass index is 22.48 kg/m.  Estimated Nutritional Needs:   Kcal:  2100-2300  Protein:  75-85  Fluid:  1.2 L per MD   Lajuan Lines, RD, LDN Clinical Nutrition After Hours/Weekend Pager # in [place]

## 2020-02-01 DIAGNOSIS — Y9301 Activity, walking, marching and hiking: Secondary | ICD-10-CM | POA: Diagnosis present

## 2020-02-01 DIAGNOSIS — D631 Anemia in chronic kidney disease: Secondary | ICD-10-CM | POA: Diagnosis not present

## 2020-02-01 DIAGNOSIS — I120 Hypertensive chronic kidney disease with stage 5 chronic kidney disease or end stage renal disease: Secondary | ICD-10-CM | POA: Diagnosis not present

## 2020-02-01 DIAGNOSIS — Z79899 Other long term (current) drug therapy: Secondary | ICD-10-CM | POA: Diagnosis not present

## 2020-02-01 DIAGNOSIS — R531 Weakness: Secondary | ICD-10-CM | POA: Diagnosis present

## 2020-02-01 DIAGNOSIS — F17210 Nicotine dependence, cigarettes, uncomplicated: Secondary | ICD-10-CM | POA: Diagnosis not present

## 2020-02-01 DIAGNOSIS — N185 Chronic kidney disease, stage 5: Secondary | ICD-10-CM | POA: Diagnosis not present

## 2020-02-01 DIAGNOSIS — Z66 Do not resuscitate: Secondary | ICD-10-CM | POA: Diagnosis not present

## 2020-02-01 DIAGNOSIS — W010XXA Fall on same level from slipping, tripping and stumbling without subsequent striking against object, initial encounter: Secondary | ICD-10-CM | POA: Diagnosis present

## 2020-02-01 DIAGNOSIS — Z20822 Contact with and (suspected) exposure to covid-19: Secondary | ICD-10-CM | POA: Diagnosis not present

## 2020-02-01 DIAGNOSIS — D510 Vitamin B12 deficiency anemia due to intrinsic factor deficiency: Secondary | ICD-10-CM | POA: Diagnosis not present

## 2020-02-01 DIAGNOSIS — R54 Age-related physical debility: Secondary | ICD-10-CM | POA: Diagnosis not present

## 2020-02-01 DIAGNOSIS — I6529 Occlusion and stenosis of unspecified carotid artery: Secondary | ICD-10-CM | POA: Diagnosis not present

## 2020-02-01 DIAGNOSIS — F209 Schizophrenia, unspecified: Secondary | ICD-10-CM | POA: Diagnosis not present

## 2020-02-01 DIAGNOSIS — M6281 Muscle weakness (generalized): Secondary | ICD-10-CM | POA: Diagnosis not present

## 2020-02-01 DIAGNOSIS — Y92008 Other place in unspecified non-institutional (private) residence as the place of occurrence of the external cause: Secondary | ICD-10-CM | POA: Diagnosis not present

## 2020-02-01 DIAGNOSIS — D649 Anemia, unspecified: Secondary | ICD-10-CM | POA: Diagnosis not present

## 2020-02-01 DIAGNOSIS — Z8673 Personal history of transient ischemic attack (TIA), and cerebral infarction without residual deficits: Secondary | ICD-10-CM | POA: Diagnosis not present

## 2020-02-01 DIAGNOSIS — D7589 Other specified diseases of blood and blood-forming organs: Secondary | ICD-10-CM | POA: Diagnosis not present

## 2020-02-01 LAB — BPAM RBC
Blood Product Expiration Date: 202112042359
Blood Product Expiration Date: 202112042359
ISSUE DATE / TIME: 202110291656
ISSUE DATE / TIME: 202110311011
Unit Type and Rh: 5100
Unit Type and Rh: 5100

## 2020-02-01 LAB — TYPE AND SCREEN
ABO/RH(D): O POS
Antibody Screen: NEGATIVE
Unit division: 0
Unit division: 0

## 2020-02-01 LAB — CBC
HCT: 23.8 % — ABNORMAL LOW (ref 39.0–52.0)
Hemoglobin: 7.6 g/dL — ABNORMAL LOW (ref 13.0–17.0)
MCH: 31.9 pg (ref 26.0–34.0)
MCHC: 31.9 g/dL (ref 30.0–36.0)
MCV: 100 fL (ref 80.0–100.0)
Platelets: 140 10*3/uL — ABNORMAL LOW (ref 150–400)
RBC: 2.38 MIL/uL — ABNORMAL LOW (ref 4.22–5.81)
RDW: 18.5 % — ABNORMAL HIGH (ref 11.5–15.5)
WBC: 6.1 10*3/uL (ref 4.0–10.5)
nRBC: 0.3 % — ABNORMAL HIGH (ref 0.0–0.2)

## 2020-02-01 LAB — INTRINSIC FACTOR ANTIBODIES: Intrinsic Factor: 89.6 AU/mL — ABNORMAL HIGH (ref 0.0–1.1)

## 2020-02-01 MED ORDER — FOLIC ACID 1 MG PO TABS: 1.0000 mg | ORAL_TABLET | Freq: Every day | ORAL | 0 refills | Status: AC

## 2020-02-01 MED ORDER — NEPRO/CARBSTEADY PO LIQD: 237.0000 mL | Freq: Two times a day (BID) | ORAL | 0 refills | Status: AC

## 2020-02-01 NOTE — TOC Transition Note (Signed)
Transition of Care [facility]) - CM/SW Discharge Note  Patient Details  Name: Eddie [name] MRN: [id number] Date of Birth: [birth year]  [facility] CM/SW Contact:  Sherie Don, LCSW Phone Number: 02/01/2020, 10:57 AM  Clinical Narrative: Patient expected to discharge. PT and OT recommended HH PT and OT. CSW updated Vaughan Basta with [facility] regarding the discharge. CSW spoke with patient regarding PT's recommendation for a rolling walker and patient is agreeable. Patient signed [employer] delivery ticket and CSW provided patient with walker. CSW updated hospitalist regarding orders for RW and HH (PT, OT). TOC signing off.  Final next level of care: [place] Barriers to Discharge: Barriers Resolved  Patient Goals and CMS Choice Patient states their goals for this hospitalization and ongoing recovery are:: Discharge home with [facility] CMS Medicare.gov Compare Post Acute Care list provided to:: Patient Choice offered to / list presented to : Patient  Discharge Plan and Services       DME Arranged: Walker rolling DME Agency: [facility] HH Arranged: PT, OT [place] Agency: [place] [facility]) Date [place]: 02/01/20 Time [place]: 7588 Representative spoke with at [place]: Vaughan Basta  Readmission Risk Interventions No flowsheet data found.

## 2020-02-01 NOTE — Discharge Instructions (Signed)
Eddie [name],  You were in the hospital because of low hemoglobin. This is likely because of your low Vitamin B12. You will need to be managed with Vitamin B12 injections and follow-up closely with your primary care physician. I am also discharging you on folic acid supplements.   Phosphorus Content of Foods Phosphorus is an important mineral that your body uses for energy and overall health. What you eat and drink can affect the amount of phosphorus in your body. The key to selecting foods with phosphorus is having the right balance for your needs.  Natural Phosphorus: Phosphorus occurs naturally in meats, dairy, grains, and vegetables. Your body absorbs about half of this natural phosphorus from foods and drinks.  Added Phosphorus: Phosphorus is also added to many foods and drinks as a preservative. Your body absorbs nearly all of the added phosphorus from foods and drinks.  How Much Phosphorus is in Food and Drinks? Nutrition Facts labels don't typically include phosphorus amounts and they don't identify whether the phosphorus in the product is natural or added. Read the ingredients list to check if a product label displays "phos" in the ingredients. This abbreviation will indicate for sure that phosphorus has been added. Ingredients with phosphorus that are most commonly added to food include disodium phosphate, sodium hexametaphosphate, phosphoric acid, calcium phosphate, and dipotassium phosphate. Foods and drinks with the highest added phosphorus are usually processed foods, packaged foods, and fast foods.  Limit These High Phosphorus in Foods  Grains and Baked Goods Processed breads and cereals with phosphorus additives on food label Biscuits, brownies, cakes, muffins, pancakes,pastries, or waffles that are ready-to-eat or made from a dry mix with "phos" on the label Refrigerated or frozen dough for biscuits, cookies, pastries, or sweet rolls with "phos" ingredients  Protein  Foods Processed meats like bacon, ham, hot dogs, chicken nuggets or strips, bologna, salami, or sausage Breaded or fried meats, chicken, fish or seafood Organ meats such as kidney or liver  Dairy and Dairy Alternatives Non-dairy creamers and some half and half creamers with "phos" Enriched dairy alternates such as almond, oat or rice beverages Processed cheese, such as American Processed cheese spreads and dips Fat free cream cheese or sour cream Ice cream, pudding or frozen yogurt Milk-based or cheese-based soups or sauces  Vegetables Vegetables with sauces added Frozen or packaged potatoes or vegetables with "phos" ingredients  Fruits Fresh, frozen or canned with added "phos" ingredients  Beverages Beverage or powdered mix with "phos" ingredients: most colas, energy and sports drinks, canned or bottled coffees and teas, flavored waters and drink mixes. Beers and wines

## 2020-02-01 NOTE — Evaluation (Signed)
Physical Therapy Evaluation Patient Details Name: Eddie [name] MRN: [id number] DOB: 02-04-1951 Today's Date: 02/01/2020   History of Present Illness  Patient presents to ED secondary to fall, patient report he did tripped, lost his balance, he denies any dizziness, lightheadedness, loss of consciousness, reports he is not on any blood thinners, he denies any pain currently, patient lives with his nephew who brought him to ED for evaluation.    Clinical Impression  Patient has to lean on nearby objects for support during sit to stands, transfers requiring use of RW for safety, overall patient able to use RW safely, but required verbal cues to continue using RW during transfer to chair secondary to tendency to push RW aside and trying to lean on nearby objects for support.  Patient limited for sitting up in chair after therapy due to fatigue, he put himself back to bed after sitting for less than 5 minutes.  Plan:  Patient to be discharged home today and discharged from physical therapy to care of nursing staff for ambulation daily as tolerated for length of stay.     Follow Up Recommendations Home health PT;Supervision for mobility/OOB;Supervision - Intermittent    Equipment Recommendations  Rolling walker with 5" wheels    Recommendations for Other Services       Precautions / Restrictions Precautions Precautions: Fall Restrictions Weight Bearing Restrictions: No      Mobility  Bed Mobility Overal bed mobility: Modified Independent                  Transfers Overall transfer level: Needs assistance Equipment used: Rolling walker (2 wheeled);None Transfers: Sit to/from [employer] to Stand: Min guard Stand pivot transfers: Min guard       General transfer comment: has to lean on nearby objects for support when not using AD, required the use of RW for safety  Ambulation/Gait Ambulation/Gait assistance: Min guard Gait Distance (Feet): 65  Feet Assistive device: Rolling walker (2 wheeled) Gait Pattern/deviations: Decreased step length - right;Decreased step length - left;Decreased stride length Gait velocity: decreased   General Gait Details: slightly labored cadence without loss of balance, limited mostly due to c/o fatigue and required verbal cues to continue using RW when returning to bedside due to patient deciding not to use RW when close to bedside/chair  Stairs            Wheelchair Mobility    Modified Rankin (Stroke Patients Only)       Balance Overall balance assessment: Needs assistance Sitting-balance support: Feet supported;No upper extremity supported Sitting balance-Leahy Scale: Good Sitting balance - Comments: seated at EOB   Standing balance support: During functional activity;No upper extremity supported Standing balance-Leahy Scale: Poor Standing balance comment: fair using RW                             Pertinent Vitals/Pain Pain Assessment: No/denies pain    Home Living Family/patient expects to be discharged to:: Private residence Living Arrangements: Other relatives Available Help at Discharge: Family;Available 24 hours/day Type of Home: House Home Access: Stairs to enter Entrance Stairs-Rails: Left Entrance Stairs-Number of Steps: 4 Home Layout: One level Home Equipment: Bedside commode;Grab bars - tub/shower;Grab bars - toilet      Prior Function Level of Independence: Needs assistance   Gait / Transfers Assistance Needed: household ambulator without AD  ADL's / Homemaking Assistance Needed: Pt reports that his Nephew took care of the grocery  shopping. Pt reports he was able to complete all ADL tasks without assistance.        Hand Dominance   Dominant Hand: Left    Extremity/Trunk Assessment   Upper Extremity Assessment Upper Extremity Assessment: Defer to OT evaluation RUE Deficits / Details: MMT: shoulder ranges; 4+/5. functional although weak gross  grasp. RUE Coordination: decreased fine motor;decreased gross motor LUE Deficits / Details: MMT: shoulder ranges: 4+/5, Decreased gross grasp. LUE Coordination: decreased fine motor;decreased gross motor    Lower Extremity Assessment Lower Extremity Assessment: Generalized weakness    Cervical / Trunk Assessment Cervical / Trunk Assessment: Normal  Communication   Communication: No difficulties  Cognition Arousal/Alertness: Awake/alert Behavior During Therapy: WFL for tasks assessed/performed Overall Cognitive Status: Impaired/Different from baseline Area of Impairment: Safety/judgement                         Safety/Judgement: Decreased awareness of safety;Decreased awareness of deficits     General Comments: required repeated verbal/tactile cueing in order to properly use RW      General Comments General comments (skin integrity, edema, etc.): Decreased dynamic standing balance noted while ambulating with PT without use of DME. Balance improved with use of RW.    Exercises     Assessment/Plan    PT Assessment All further PT needs can be met in the next venue of care  PT Problem List Decreased strength;Decreased activity tolerance;Decreased balance;Decreased mobility       PT Treatment Interventions      PT Goals (Current goals can be found in the Care Plan section)  Acute Rehab PT Goals Patient Stated Goal: return home with family to assist PT Goal Formulation: With patient Time For Goal Achievement: 02/01/20 Potential to Achieve Goals: Good    Frequency     Barriers to discharge        Co-evaluation PT/OT/SLP Co-Evaluation/Treatment: Yes Reason for Co-Treatment: For patient/therapist safety PT goals addressed during session: Mobility/safety with mobility;Balance OT goals addressed during session: ADL's and self-care;Proper use of Adaptive equipment and DME;Strengthening/ROM       AM-PAC PT "6 Clicks" Mobility  Outcome Measure Help needed  turning from your back to your side while in a flat bed without using bedrails?: None Help needed moving from lying on your back to sitting on the side of a flat bed without using bedrails?: None Help needed moving to and from a bed to a chair (including a wheelchair)?: A Little Help needed standing up from a chair using your arms (e.g., wheelchair or bedside chair)?: A Little Help needed to walk in hospital room?: A Little Help needed climbing 3-5 steps with a railing? : A Lot 6 Click Score: 19    End of Session   Activity Tolerance: Patient tolerated treatment well;Patient limited by fatigue Patient left: in chair;with call bell/phone within reach Nurse Communication: Mobility status PT Visit Diagnosis: Unsteadiness on feet (R26.81);Other abnormalities of gait and mobility (R26.89);Muscle weakness (generalized) (M62.81)    Time: 2248-2500 PT Time Calculation (min) (ACUTE ONLY): 21 min   Charges:   PT Evaluation $PT Eval Moderate Complexity: 1 Mod PT Treatments $Therapeutic Activity: 8-22 mins        12:17 PM, 02/01/20 Lonell Grandchild, MPT Physical Therapist with [facility] [phone] office [phone] mobile phone

## 2020-02-01 NOTE — Progress Notes (Signed)
Patient and family state understanding of discharge instructions 

## 2020-02-01 NOTE — Evaluation (Signed)
Occupational Therapy Evaluation Patient Details Name: Eddie [name] MRN: [id number] DOB: [birth year] Today's Date: 02/01/2020    History of Present Illness Patient presents to ED secondary to fall, patient report he did tripped, lost his balance, he denies any dizziness, lightheadedness, loss of consciousness, reports he is not on any blood thinners, he denies any pain currently, patient lives with his nephew who brought him to ED for evaluation.   Clinical Impression   Patient displays decreased safety awareness related to deficits and need utilize DME and request supervision for mobility causing him to be a risk for falls. Patient also demonstrates decreased bilateral coordination completing functional ADL tasks during evaluation. Recommend Home Health OT services at discharge to focus on mentioned deficits and allow him to return to completing ADL tasks at a Modified Independent level.     Follow Up Recommendations  Home health OT;Supervision - Intermittent    Equipment Recommendations  Tub/shower seat       Precautions / Restrictions Precautions Precautions: Fall Restrictions Weight Bearing Restrictions: No      Mobility Bed Mobility Overal bed mobility: Modified Independent      Transfers     General transfer comment: See PT evaluation for transfer performance.    Balance Overall balance assessment: Mild deficits observed, not formally tested;History of Falls        ADL either performed or assessed with clinical judgement   ADL Overall ADL's : Needs assistance/impaired Eating/Feeding: Set up;Bed level   Grooming: Wash/dry hands;Wash/dry face;Supervision/safety;Sitting   Upper Body Bathing: Supervision/ safety;Sitting   Lower Body Bathing: Supervison/ safety;Sitting/lateral leans;Sit to/from stand   Upper Body Dressing : Supervision/safety;Sitting   Lower Body Dressing: Supervision/safety;Sit to/from stand;Sitting/lateral leans   Toilet Transfer: Product manager Baseline Vision/History: No visual deficits Patient Visual Report: No change from baseline              Pertinent Vitals/Pain Pain Assessment: No/denies pain     Hand Dominance Left   Extremity/Trunk Assessment Upper Extremity Assessment Upper Extremity Assessment: Generalized weakness;RUE deficits/detail;LUE deficits/detail RUE Deficits / Details: MMT: shoulder ranges; 4+/5. functional although weak gross grasp. RUE Coordination: decreased fine motor;decreased gross motor LUE Deficits / Details: MMT: shoulder ranges: 4+/5, Decreased gross grasp. LUE Coordination: decreased fine motor;decreased gross motor   Lower Extremity Assessment Lower Extremity Assessment: Defer to PT evaluation       Communication Communication Communication: No difficulties   Cognition Arousal/Alertness: Awake/alert Behavior During Therapy: WFL for tasks assessed/performed Overall Cognitive Status: Impaired/Different from baseline Area of Impairment: Safety/judgement     Safety/Judgement: Decreased awareness of safety;Decreased awareness of deficits     General Comments: At end of evaluation, patient was asked to sit up in recliner for a while and when he wanted to go to the bathroom or get out of the chair to press his call light for the nurse. While standing outside of patient's room, he transferred himself back to bed without following safety instructions. While using RW with PT, patient demonstrated decreased safety use of RW.   General Comments  Decreased dynamic standing balance noted while ambulating with PT without use of DME. Balance improved with use of RW.            Home Living Family/patient expects to be discharged to:: Private residence Living Arrangements: Other relatives (nephews and nephew's wife) Available Help at Discharge: Family;Available 24 hours/day Type of Home: House   Bathroom Shower/Tub: Hospital doctor  Toilet: Standard Bathroom Accessibility: Yes   Home Equipment: Bedside commode;Grab bars - tub/shower;Grab bars - toilet          Prior Functioning/Environment Level of Independence: Needs assistance    ADL's / Homemaking Assistance Needed: Pt reports that his Nephew took care of the grocery shopping. Pt reports he was able to complete all ADL tasks without assistance.            OT Problem List: Decreased coordination;Impaired balance (sitting and/or standing);Decreased safety awareness                       Co-evaluation PT/OT/SLP Co-Evaluation/Treatment: Yes Reason for Co-Treatment: To address functional/ADL transfers;For patient/therapist safety   OT goals addressed during session: ADL's and self-care;Proper use of Adaptive equipment and DME;Strengthening/ROM      AM-PAC OT "6 Clicks" Daily Activity     Outcome Measure Help from another person eating meals?: None Help from another person taking care of personal grooming?: A Little Help from another person toileting, which includes using toliet, bedpan, or urinal?: A Little Help from another person bathing (including washing, rinsing, drying)?: A Little Help from another person to put on and taking off regular upper body clothing?: A Little Help from another person to put on and taking off regular lower body clothing?: A Little 6 Click Score: 19   End of Session Equipment Utilized During Treatment: Rolling walker;Gait belt  Activity Tolerance: Patient tolerated treatment well Patient left: in chair;with call bell/phone within reach  OT Visit Diagnosis: Repeated falls (R29.6);Muscle weakness (generalized) (M62.81)                Time: 2025-4270 OT Time Calculation (min): 12 min Charges:  OT General Charges $OT Visit: 1 Visit OT Evaluation $OT Eval Low Complexity: [place], OTR/L,CBIS  [phone]   Quaron Delacruz, Clarene Duke 02/01/2020, 10:25 AM

## 2020-02-04 ENCOUNTER — Telehealth (INDEPENDENT_AMBULATORY_CARE_PROVIDER_SITE_OTHER): Payer: Self-pay

## 2020-02-04 NOTE — Telephone Encounter (Signed)
Yes, you can go ahead and give verbal orders for all these orders.  Thank you.

## 2020-02-04 NOTE — Telephone Encounter (Signed)
Eddie [name], physical therapist from [facility] called and left a VM requesting verbal orders for PT, 2 times per week for 5 weeks, then 1 time weekly for 3 weeks, address balance, activity tolerance, gait, and a few other requests that I could not hear clearly. Also requesting social worker consult for treat and eval as indicated.  Please advise if OK to order.

## 2020-02-04 NOTE — Telephone Encounter (Signed)
Called Armando Reichert with PT at [phone] and gave him the verbal OK per Dr. Anastasio Champion. Mr. Ronnald Ramp verbalized an understanding.

## 2020-02-08 ENCOUNTER — Telehealth (INDEPENDENT_AMBULATORY_CARE_PROVIDER_SITE_OTHER): Payer: Self-pay

## 2020-02-08 ENCOUNTER — Ambulatory Visit (INDEPENDENT_AMBULATORY_CARE_PROVIDER_SITE_OTHER): Payer: Medicaid Other | Admitting: Nurse Practitioner

## 2020-02-08 NOTE — Telephone Encounter (Signed)
Yes, I agree with this.  Thanks.

## 2020-02-08 NOTE — Telephone Encounter (Signed)
Aaron Edelman from [facility] called and is asking for approval for OT for 1 X a week for 3 weeks to help balance and endurance for self care and house keeping. [phone] Please advise.

## 2020-02-08 NOTE — Telephone Encounter (Signed)
Called patient and LMOVM to return call  Called and left a detailed voice message from Dr. Anastasio Champion for the verbal OK per Dr. Anastasio Champion.

## 2020-02-09 ENCOUNTER — Encounter (INDEPENDENT_AMBULATORY_CARE_PROVIDER_SITE_OTHER): Payer: Self-pay | Admitting: Nurse Practitioner

## 2020-02-09 ENCOUNTER — Telehealth (INDEPENDENT_AMBULATORY_CARE_PROVIDER_SITE_OTHER): Payer: Self-pay

## 2020-02-09 ENCOUNTER — Telehealth (INDEPENDENT_AMBULATORY_CARE_PROVIDER_SITE_OTHER): Payer: Self-pay | Admitting: Nurse Practitioner

## 2020-02-09 ENCOUNTER — Ambulatory Visit (INDEPENDENT_AMBULATORY_CARE_PROVIDER_SITE_OTHER): Payer: Medicare Other | Admitting: Nurse Practitioner

## 2020-02-09 ENCOUNTER — Other Ambulatory Visit: Payer: Self-pay

## 2020-02-09 VITALS — BP 133/87 | HR 69 | Temp 97.5°F | Resp 19 | Ht 74.0 in | Wt 172.4 lb

## 2020-02-09 DIAGNOSIS — D631 Anemia in chronic kidney disease: Secondary | ICD-10-CM

## 2020-02-09 DIAGNOSIS — N185 Chronic kidney disease, stage 5: Secondary | ICD-10-CM | POA: Diagnosis not present

## 2020-02-09 DIAGNOSIS — R062 Wheezing: Secondary | ICD-10-CM

## 2020-02-09 DIAGNOSIS — I10 Essential (primary) hypertension: Secondary | ICD-10-CM | POA: Diagnosis not present

## 2020-02-09 MED ORDER — ALBUTEROL SULFATE HFA 108 (90 BASE) MCG/ACT IN AERS: 1.0000 | INHALATION_SPRAY | Freq: Four times a day (QID) | RESPIRATORY_TRACT | 0 refills | Status: AC | PRN

## 2020-02-09 NOTE — Progress Notes (Addendum)
Subjective:  Patient ID: Eddie [name], male    DOB: 05/14/1950  Age: 69 y.o. MRN: [id number]  CC:  Chief Complaint  Patient presents with   Follow-up    Posthospital Discharge      HPI  This patient arrives today for the above.  He was recently hospitalized on 10/29 through 11/1 secondary to fall.  He had stated at the hospital that he fell due to tripping but has not felt dizzy.  While there he was found to be anemic with a hemoglobin of 6.7 and was Hemoccult negative.  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  He does follow with Dr. Theador Hawthorne as his nephrologist.  While in the hospital he did have blood transfusion.  Today, the patient tells me he is feeling fairly well.  He denies any chest pain, shortness of breath, pruritus, significant fatigue, significant anorexia, etc.  He does endorse some mild wheezing and mild reduction in appetite.  Earlier today patient's home health physical therapist to call the office to report that the blood pressure was very elevated at systolic blood pressure around 190.  Physical therapy did not work with him at that time due to the elevated blood pressure.  According to the patient's family member who accompanies the patient today, this blood pressure was taken prior to the patient taking any of his antihypertensives.  The patient is currently on amlodipine, clonidine, and hydralazine.  The family member tells me it can be challenging to make sure the patient takes medications regularly because the patient sleep schedule changes regularly.   Past Medical History:  Diagnosis Date   Carotid artery occlusion    Chronic kidney disease    Hypertension    Schizophrenia ([place])    Stroke ([place])       Family History  Problem Relation Age of Onset   Heart disease Other    Diabetes Other    Kidney disease Other     Social History   Social History Narrative   Not on file   Social History   Tobacco Use    Smoking status: Current Every Day Smoker    Packs/day: 1.00    Years: 50.00    Pack years: 50.00    Types: Cigarettes   Smokeless tobacco: Never Used  Substance Use Topics   Alcohol use: No    Alcohol/week: 0.0 standard drinks     Current Meds  Medication Sig   amLODipine (NORVASC) 10 MG tablet Take 1 tablet (10 mg total) by mouth daily.   cloNIDine (CATAPRES) 0.1 MG tablet Take 2 tablets by mouth 3 (three) times daily.   ergocalciferol (VITAMIN D2) 1.25 MG (50000 UT) capsule Take 1 capsule by mouth once a week.   folic acid (FOLVITE) 1 MG tablet Take 1 tablet (1 mg total) by mouth daily.   hydrALAZINE (APRESOLINE) 100 MG tablet Take 100 mg by mouth 3 (three) times daily.   Nutritional Supplements (FEEDING SUPPLEMENT, NEPRO CARB STEADY,) LIQD Take 237 mLs by mouth 2 (two) times daily between meals.   paliperidone (INVEGA SUSTENNA) 156 MG/ML SUSY injection Inject 156 mg into the muscle every 30 (thirty) days.    ROS:  See HPI   Objective:   Today's Vitals: BP 133/87 (BP Location: Left Arm, Patient Position: Sitting, Cuff Size: Normal)    Pulse 69    Temp (!) 97.5 F (36.4 C) (Temporal)    Resp 19    Ht 6' 2"  (1.88  m)    Wt 172 lb 6.4 oz (78.2 kg)    SpO2 95%    BMI 22.13 kg/m  Vitals with BMI 02/09/2020 02/01/2020 02/01/2020  Height 6' 2"  - -  Weight 172 lbs 6 oz - -  BMI 77.93 - -  Systolic 903 009 233  Diastolic 87 68 70  Pulse 69 68 67     Physical Exam Vitals reviewed.  Constitutional:      Appearance: Normal appearance.  HENT:     Head: Normocephalic and atraumatic.  Cardiovascular:     Rate and Rhythm: Normal rate and regular rhythm.  Pulmonary:     Effort: Pulmonary effort is normal.     Breath sounds: Wheezing present.  Musculoskeletal:     Cervical back: Neck supple.  Skin:    General: Skin is warm and dry.  Neurological:     Mental Status: He is alert and oriented to person, place, and time.  Psychiatric:        Mood and Affect: Mood normal.         Behavior: Behavior normal.        Thought Content: Thought content normal.        Judgment: Judgment normal.          Assessment and Plan   1. Anemia due to stage 5 chronic kidney disease, not on chronic dialysis ([place])   2. Wheeze   3. Essential hypertension      Plan: 1.  We will check CBC and CMP for further evaluation today.  I again had a long conversation with the patient and the patient's only member regarding the role of hemodialysis and that if the patient elects not to initiate hemodialysis he very well will die.  The patient acknowledges this and continues to tell me he does not want to be on dialysis.  I will refer patient to palliative care for further assistance with managing his needs especially as his kidney function continues to decline.  Patient was encouraged to follow-up with his nephrologist as scheduled and if the patient changes his mind does want to have dialysis he was encouraged to notify the nephrologist.  They tell me he understands. 2.  Mild wheezing noted on exam will prescribe albuterol inhaler the patient can take as needed for wheezing or shortness of breath. 3.  The patient's blood pressure is much better in the office today since taking his medication after the physical therapist has left.  He will continue on current medication regimen as prescribed.   Of note, the patient still member tells me he believes the patient was given the flu shot while the patient was in the hospital.  I do not see evidence of this in the medical record.  I have reached out to patient's nephrologist to determine if there is any contraindication to administering high-dose flu shot as well as PPSV23 vaccine to an individual has stage V CKD and is now on hemodialysis.  If no contraindication exists we will try to verify whether or not patient received a flu shot in the hospital and if he did not will attempt to administer flu and possibly PPSV23 vaccine at next office  visit.  Update: I have heard from Dr. Theador Hawthorne, he states no contraindication for this patient regarding flu and PPSV23 being administered.  In addition I reviewed medications listed in patient's recent hospitalization I do not see where a flu shot was administered.  Will have patient called and get him scheduled for  nurse visit to have flu and PPSV23 vaccines administered.  Tests ordered Orders Placed This Encounter  Procedures   CBC with Differential/Platelets   CMP with eGFR([facility])   Amb Referral to Palliative Care      Meds ordered this encounter  Medications   albuterol (VENTOLIN HFA) 108 (90 Base) MCG/ACT inhaler    Sig: Inhale 1 puff into the lungs every 6 (six) hours as needed for wheezing or shortness of breath.    Dispense:  8 g    Refill:  0    Order Specific Question:   Supervising Provider    Answer:   Doree Albee [[id number]]    Patient to follow-up in 1 month or sooner as needed.  Ailene Ards, NP

## 2020-02-09 NOTE — Telephone Encounter (Signed)
Anne Ng from [facility] called to let us know that the patient had PT today and had an elevated BP of 190/94 and his heart rate was 96. Anne Ng is aware of his appointment with [place] today and wanted to let us know. Her number is [phone] if she needs to be reached.

## 2020-02-09 NOTE — Patient Instructions (Signed)
Food Basics for Chronic Kidney Disease When your kidneys are not working well, they cannot remove waste and excess substances from your blood as effectively as they did before. This can lead to a buildup and imbalance of these substances, which can worsen kidney damage and affect how your body functions. Certain foods lead to a buildup of these substances in the body. By changing your diet as recommended by your diet and nutrition specialist (dietitian) or health care provider, you could help prevent further kidney damage and delay or prevent the need for dialysis. What are tips for following this plan? General instructions   Work with your health care provider and dietitian to develop a meal plan that is right for you. Foods you can eat, limit, or avoid will be different for each person depending on the stage of kidney disease and any other existing health conditions.  Talk with your health care provider about whether you should take a vitamin and mineral supplement.  Use standard measuring cups and spoons to measure servings of foods. Use a kitchen scale to measure portions of protein foods.  If directed by your health care provider, avoid drinking too much fluid. Measure and count all liquids, including water, ice, soups, flavored gelatin, and frozen desserts such as popsicles or ice cream. Reading food labels  Check the amount of sodium in foods. Choose foods that have less than 300 milligrams (mg) per serving.  Check the ingredient list for phosphorus or potassium-based additives or preservatives.  Check the amount of saturated and trans fat. Limit or avoid these fats as told by your dietitian. Shopping  Avoid buying foods that are: ? Processed, frozen, or prepackaged. ? Calcium-enriched or fortified.  Do not buy foods that have salt or sodium listed among the first five ingredients.  Do not buy canned vegetables. Cooking  Replace animal proteins, such as meat, fish, eggs, or  dairy, with plant proteins from beans, nuts, and soy. ? Use soy milk instead of cow's milk. ? Add beans or tofu to soups, casseroles, or pasta dishes instead of meat.  Soak vegetables, such as potatoes, before cooking to reduce potassium. To do this: ? Peel and cut into small pieces. ? Soak in warm water for at least 2 hours. For every 1 cup of vegetables, use 10 cups of water. ? Drain and rinse with warm water. ? Boil for at least 5 minutes. Meal planning  Limit the amount of protein from plant and animal sources you eat each day.  Do not add salt to food when cooking or before eating.  Eat meals and snacks at around the same time each day. If you have diabetes:  If you have diabetes (diabetes mellitus) and chronic kidney disease, it is important to keep your blood glucose in the target range recommended by your health care provider. Follow your diabetes management plan. This may include: ? Checking your blood glucose regularly. ? Taking oral medicines, insulin, or both. ? Exercising for at least 30 minutes on 5 or more days each week, or as told by your health care provider. ? Tracking how many servings of carbohydrates you eat at each meal.  You may be given specific guidelines on how much of certain foods and nutrients you may eat, depending on your stage of kidney disease and whether you have high blood pressure (hypertension). Follow your meal plan as told by your dietitian. What nutrients should be limited? The items listed are not a complete list. Talk with your dietitian   about what dietary choices are best for you. Potassium Potassium affects how steadily your heart beats. If too much potassium builds up in your blood, it can cause an irregular heartbeat or even a heart attack. You may need to eat less potassium, depending on your blood potassium levels and the stage of kidney disease. Talk to your dietitian about how much potassium you may have each day. You may need to limit  or avoid foods that are high in potassium, such as:  Milk and soy milk.  Fruits, such as bananas, papaya, apricots, nectarines, melon, prunes, raisins, kiwi, and oranges.  Vegetables, such as potatoes, sweet potatoes, yams, tomatoes, leafy greens, beets, okra, avocado, pumpkin, and winter squash.  White and lima beans. Phosphorus Phosphorus is a mineral found in your bones. A balance between calcium and phosphorous is needed to build and maintain healthy bones. Too much phosphorus pulls calcium from your bones. This can make your bones weak and more likely to break. Too much phosphorus can also make your skin itch. You may need to eat less phosphorus depending on your blood phosphorus levels and the stage of kidney disease. Talk to your dietitian about how much potassium you may have each day. You may need to take medicine to lower your blood phosphorus levels if diet changes do not help. You may need to limit or avoid foods that are high in phosphorus, such as:  Milk and dairy products.  Dried beans and peas.  Tofu, soy milk, and other soy-based meat replacements.  Colas.  Nuts and peanut butter.  Meat, poultry, and fish.  Bran cereals and oatmeals. Protein Protein helps you to make and keep muscle. It also helps in the repair of your body's cells and tissues. One of the natural breakdown products of protein is a waste product called urea. When your kidneys are not working properly, they cannot remove wastes, such as urea, like they did before you developed chronic kidney disease. Reducing how much protein you eat can help prevent a buildup of urea in your blood. Depending on your stage of kidney disease, you may need to limit foods that are high in protein. Sources of animal protein include:  Meat (all types).  Fish and seafood.  Poultry.  Eggs.  Dairy. Other protein foods include:  Beans and legumes.  Nuts and nut butter.  Soy and tofu. Sodium Sodium, which is found  in salt, helps maintain a healthy balance of fluids in your body. Too much sodium can increase your blood pressure and have a negative effect on the function of your heart and lungs. Too much sodium can also cause your body to retain too much fluid, making your kidneys work harder. Most people should have less than 2,300 milligrams (mg) of sodium each day. If you have hypertension, you may need to limit your sodium to 1,500 mg each day. Talk to your dietitian about how much sodium you may have each day. You may need to limit or avoid foods that are high in sodium, such as:  Salt seasonings.  Soy sauce.  Cured and processed meats.  Salted crackers and snack foods.  Fast food.  Canned soups and most canned foods.  Pickled foods.  Vegetable juice.  Boxed mixes or ready-to-eat boxed meals and side dishes.  Bottled dressings, sauces, and marinades. Summary  Chronic kidney disease can lead to a buildup and imbalance of waste and excess substances in the body. Certain foods lead to a buildup of these substances. By adjusting   your intake of these foods, you could help prevent more kidney damage and delay or prevent the need for dialysis.  Food adjustments are different for each person with chronic kidney disease. Work with a dietitian to set up nutrient goals and a meal plan that is right for you.  If you have diabetes and chronic kidney disease, it is important to keep your blood glucose in the target range recommended by your health care provider. This information is not intended to replace advice given to you by your health care provider. Make sure you discuss any questions you have with your health care provider. Document Revised: 07/10/2018 Document Reviewed: 03/14/2016 Elsevier Patient Education  2020 [employer]  

## 2020-02-09 NOTE — Telephone Encounter (Signed)
Called patient and gave him the message and he stated that he will get both of these vaccines at his next visit with [place]. Patient verbalized an understanding.

## 2020-02-09 NOTE — Telephone Encounter (Signed)
Please call this patient and let him know that I heard from Dr. Theador Hawthorne. He states that the patient can have both the flu shot and the pneumonia vaccine. They can come either as scheduled next month for this or if they want to come for a nurse's visit for that that's fine too. I don't see any documentation from his visit in the hospital that a flu shot was administered so we are going to plan on administering both high-dose flu and PPSV23 when he comes back either for nurse's visit or for his office visit next month.

## 2020-02-10 LAB — CBC WITH DIFFERENTIAL/PLATELET
Absolute Monocytes: 612 cells/uL (ref 200–950)
Basophils Absolute: 51 cells/uL (ref 0–200)
Basophils Relative: 0.6 %
Eosinophils Absolute: 281 cells/uL (ref 15–500)
Eosinophils Relative: 3.3 %
HCT: 25.3 % — ABNORMAL LOW (ref 38.5–50.0)
Hemoglobin: 8.5 g/dL — ABNORMAL LOW (ref 13.2–17.1)
Lymphs Abs: 799 cells/uL — ABNORMAL LOW (ref 850–3900)
MCH: 32.1 pg (ref 27.0–33.0)
MCHC: 33.6 g/dL (ref 32.0–36.0)
MCV: 95.5 fL (ref 80.0–100.0)
MPV: 10.1 fL (ref 7.5–12.5)
Monocytes Relative: 7.2 %
Neutro Abs: 6758 cells/uL (ref 1500–7800)
Neutrophils Relative %: 79.5 %
Platelets: 176 10*3/uL (ref 140–400)
RBC: 2.65 10*6/uL — ABNORMAL LOW (ref 4.20–5.80)
RDW: 17.1 % — ABNORMAL HIGH (ref 11.0–15.0)
Total Lymphocyte: 9.4 %
WBC: 8.5 10*3/uL (ref 3.8–10.8)

## 2020-02-10 LAB — COMPLETE METABOLIC PANEL WITH GFR
AG Ratio: 1.3 (calc) (ref 1.0–2.5)
ALT: 6 U/L — ABNORMAL LOW (ref 9–46)
AST: 13 U/L (ref 10–35)
Albumin: 3.7 g/dL (ref 3.6–5.1)
Alkaline phosphatase (APISO): 70 U/L (ref 35–144)
BUN/Creatinine Ratio: 8 (calc) (ref 6–22)
BUN: 98 mg/dL — ABNORMAL HIGH (ref 7–25)
CO2: 19 mmol/L — ABNORMAL LOW (ref 20–32)
Calcium: 8.2 mg/dL — ABNORMAL LOW (ref 8.6–10.3)
Chloride: 106 mmol/L (ref 98–110)
Creat: 12.24 mg/dL — ABNORMAL HIGH (ref 0.70–1.25)
GFR, Est African American: 4 mL/min/{1.73_m2} — ABNORMAL LOW (ref >=60)
GFR, Est Non African American: 4 mL/min/{1.73_m2} — ABNORMAL LOW (ref >=60)
Globulin: 2.9 g/dL (calc) (ref 1.9–3.7)
Glucose, Bld: 119 mg/dL — ABNORMAL HIGH (ref 65–99)
Potassium: 4.4 mmol/L (ref 3.5–5.3)
Sodium: 137 mmol/L (ref 135–146)
Total Bilirubin: 0.6 mg/dL (ref 0.2–1.2)
Total Protein: 6.6 g/dL (ref 6.1–8.1)

## 2020-02-11 ENCOUNTER — Telehealth (INDEPENDENT_AMBULATORY_CARE_PROVIDER_SITE_OTHER): Payer: Self-pay | Admitting: Nurse Practitioner

## 2020-02-11 NOTE — Telephone Encounter (Addendum)
Please call this patient and let him know that his blood work shows stable hemoglobin (stable anemia) and his kidney function remains reduced. He also had low calcium. I reached out to Dr. Anastasio Champion and he spoke to the patient's nephrologist. The nephrologist recommends that the patient take Tums 1 tab by mouth three times a day to supplement his calcium. If he has any questions please let me know. I will add this medication to the patient's med list.

## 2020-02-11 NOTE — Telephone Encounter (Signed)
Called patient and spoke to his nephew and gave him the message from Judson Roch. Alphany verbalized an understanding and stated that he will get the tums for the patient.

## 2020-02-15 ENCOUNTER — Telehealth (INDEPENDENT_AMBULATORY_CARE_PROVIDER_SITE_OTHER): Payer: Self-pay

## 2020-02-15 NOTE — Telephone Encounter (Signed)
[name] with [facility] called and stated that she was seeing the patient for PT and the patient has an elevated BP of 180/84 and Heart rate is 104, also wheezing throughout and right side is greater than left side. Sending as Eddie [name]. If Anne Ng needs to be contacted she can be reached at [phone].

## 2020-02-15 NOTE — Telephone Encounter (Signed)
Please call Anne Ng back and make sure that the patient is saturating O2 wise okay.  I do not like the idea of him wheezing.  Obviously, if the oxygen saturation is below 92%, I would recommend that he needs to go to the emergency room.

## 2020-02-15 NOTE — Telephone Encounter (Signed)
Called [place] and she stated that the patient's O2 was 97 today. Anne Ng stated that the patient has declined to do the PT and is also having swelling in both legs. Anne Ng will probably be sending a request for nurse eval at some point.

## 2020-02-15 NOTE — Telephone Encounter (Signed)
Okay, thanks

## 2020-02-22 ENCOUNTER — Telehealth (INDEPENDENT_AMBULATORY_CARE_PROVIDER_SITE_OTHER): Payer: Self-pay

## 2020-02-22 NOTE — Telephone Encounter (Signed)
Sure, you can give them a verbal order.  Thanks.

## 2020-02-29 ENCOUNTER — Telehealth (INDEPENDENT_AMBULATORY_CARE_PROVIDER_SITE_OTHER): Payer: Self-pay

## 2020-02-29 NOTE — Telephone Encounter (Signed)
Yes, you can go ahead and give verbal orders.  Thanks.

## 2020-02-29 NOTE — Telephone Encounter (Signed)
Called Christy with [facility] and left a detailed voice message to let her know OK to proceed with request per Dr. Anastasio Champion.

## 2020-02-29 NOTE — Telephone Encounter (Signed)
Christy with [facility] called and left a voice message requesting verbal orders for skilled nursing 1 X a week for 1 week, 2 X a week for 1 week, and 1 X week for 4 weeks for education and management. Please let Alyse Low know at [phone] and OK to leave a VM or can call the office at [phone], opt 2.  Please advise if OK for orders?

## 2020-03-01 ENCOUNTER — Telehealth (INDEPENDENT_AMBULATORY_CARE_PROVIDER_SITE_OTHER): Payer: Self-pay

## 2020-03-01 ENCOUNTER — Other Ambulatory Visit (INDEPENDENT_AMBULATORY_CARE_PROVIDER_SITE_OTHER): Payer: Self-pay | Admitting: Internal Medicine

## 2020-03-01 DIAGNOSIS — D830 Common variable immunodeficiency with predominant abnormalities of B-cell numbers and function: Secondary | ICD-10-CM

## 2020-03-01 DIAGNOSIS — R5381 Other malaise: Secondary | ICD-10-CM

## 2020-03-01 DIAGNOSIS — R54 Age-related physical debility: Secondary | ICD-10-CM

## 2020-03-01 NOTE — Telephone Encounter (Signed)
Arvil Persons from [facility] called and left a detailed voice message that the niece wants to order a hospital bed for patient because he is having difficulty standing and getting in and out of bed and has bilateral leg edema and needs to elevate both legs and Joelene Millin is requesting to order the hospital bed from [facility].   Joelene Millin can be reached at [phone].  Joelene Millin called a 2nd time and stated that the patient needs an [employer] [place]. The PCS form can be downloaded from the [employer] website and this will help get patient assistance in the home.  Please advise once complete.

## 2020-03-01 NOTE — Telephone Encounter (Signed)
Can you look into this?  Thanks. 

## 2020-03-01 NOTE — Telephone Encounter (Signed)
Christy from [facility] called and left a detailed VM that she went to his home and no one answered the door and she was not able to do her visit today with patient. Alyse Low stated that if you have any questions to please contact her at [phone].

## 2020-03-02 DIAGNOSIS — I120 Hypertensive chronic kidney disease with stage 5 chronic kidney disease or end stage renal disease: Secondary | ICD-10-CM

## 2020-03-02 DIAGNOSIS — F209 Schizophrenia, unspecified: Secondary | ICD-10-CM

## 2020-03-02 DIAGNOSIS — I6529 Occlusion and stenosis of unspecified carotid artery: Secondary | ICD-10-CM

## 2020-03-02 DIAGNOSIS — N185 Chronic kidney disease, stage 5: Secondary | ICD-10-CM

## 2020-03-02 DIAGNOSIS — Z8673 Personal history of transient ischemic attack (TIA), and cerebral infarction without residual deficits: Secondary | ICD-10-CM

## 2020-03-02 DIAGNOSIS — Z9181 History of falling: Secondary | ICD-10-CM

## 2020-03-02 DIAGNOSIS — F17210 Nicotine dependence, cigarettes, uncomplicated: Secondary | ICD-10-CM

## 2020-03-02 DIAGNOSIS — R296 Repeated falls: Secondary | ICD-10-CM

## 2020-03-02 NOTE — Telephone Encounter (Signed)
Faxed over to [facility] care and to [facility].

## 2020-03-02 NOTE — Telephone Encounter (Signed)
Thanks

## 2020-03-09 ENCOUNTER — Ambulatory Visit (INDEPENDENT_AMBULATORY_CARE_PROVIDER_SITE_OTHER): Payer: Medicaid Other | Admitting: Nurse Practitioner

## 2020-04-02 DEATH — deceased

## 2020-11-03 ENCOUNTER — Encounter (INDEPENDENT_AMBULATORY_CARE_PROVIDER_SITE_OTHER): Payer: Self-pay

## 2021-07-12 IMAGING — DX DG CHEST 1V PORT
2 series · 2 of 2 positions shown · non-contrast
Comparison: None.

CLINICAL DATA: Fall, weakness.

EXAM:
PORTABLE CHEST 1 VIEW

[chest ap (1 of 2)]
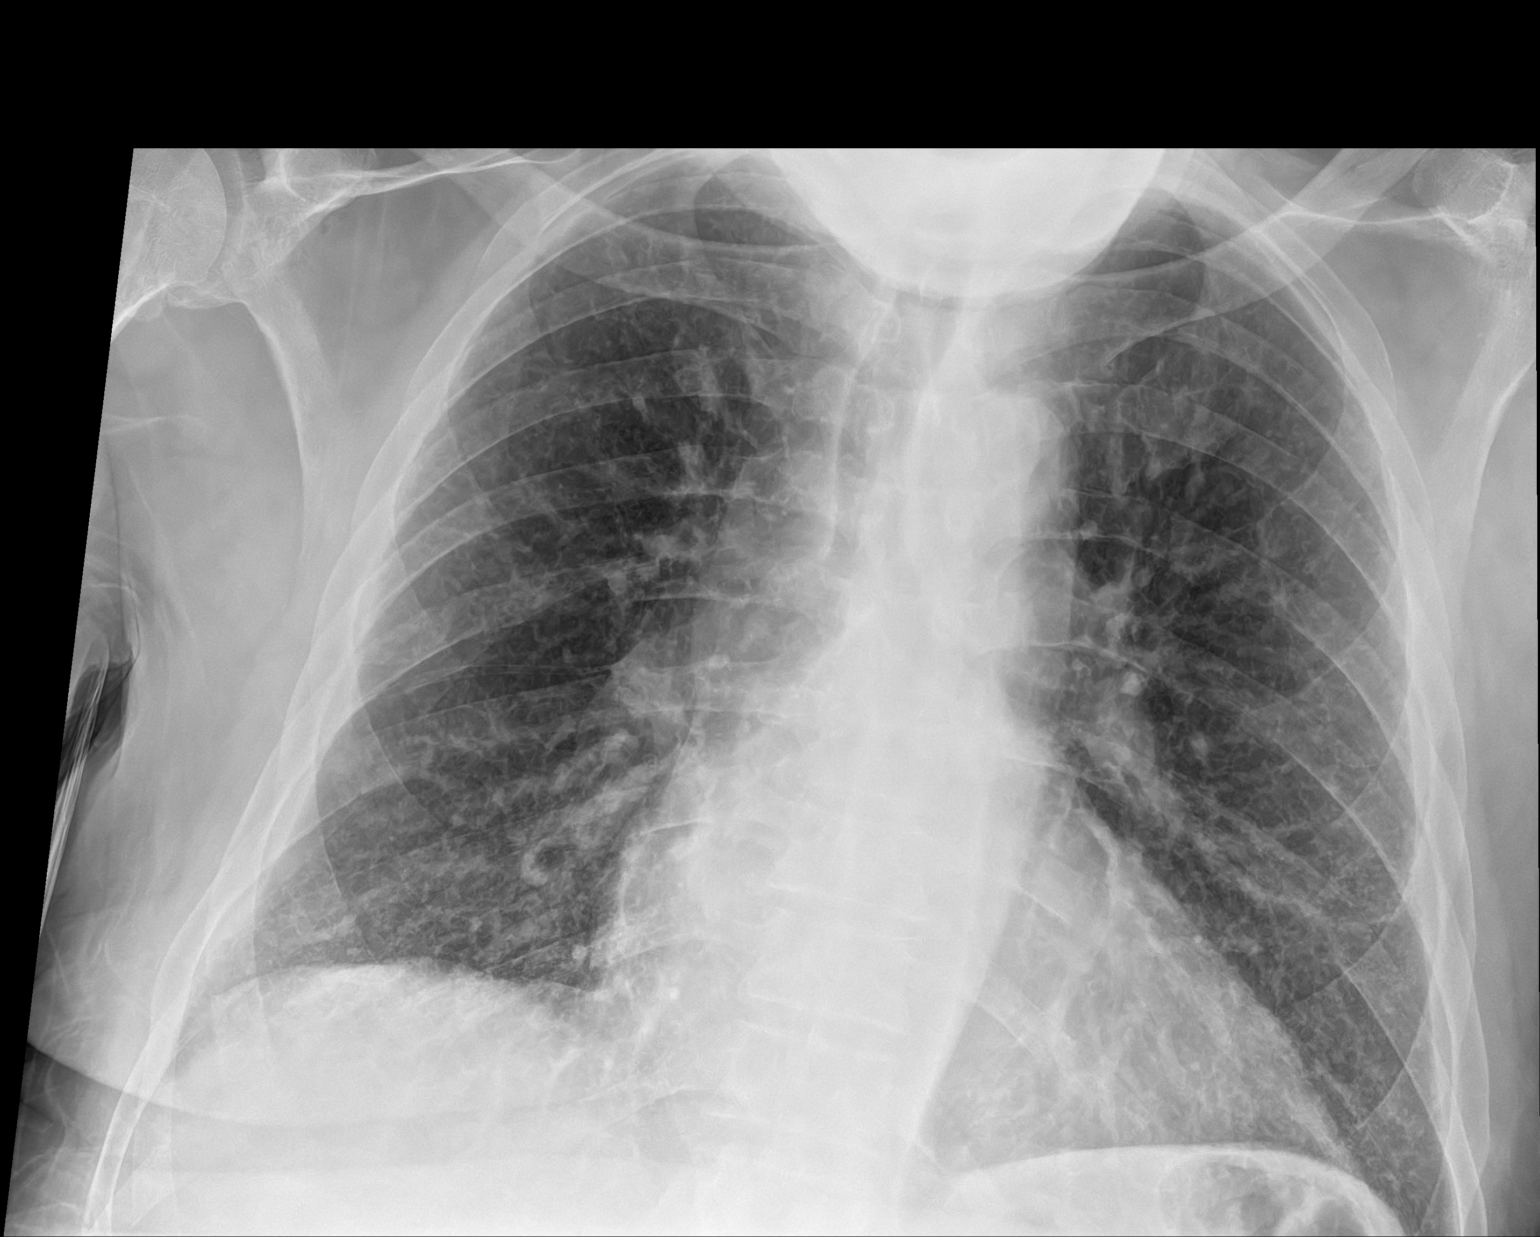

[chest ap (2 of 2)]
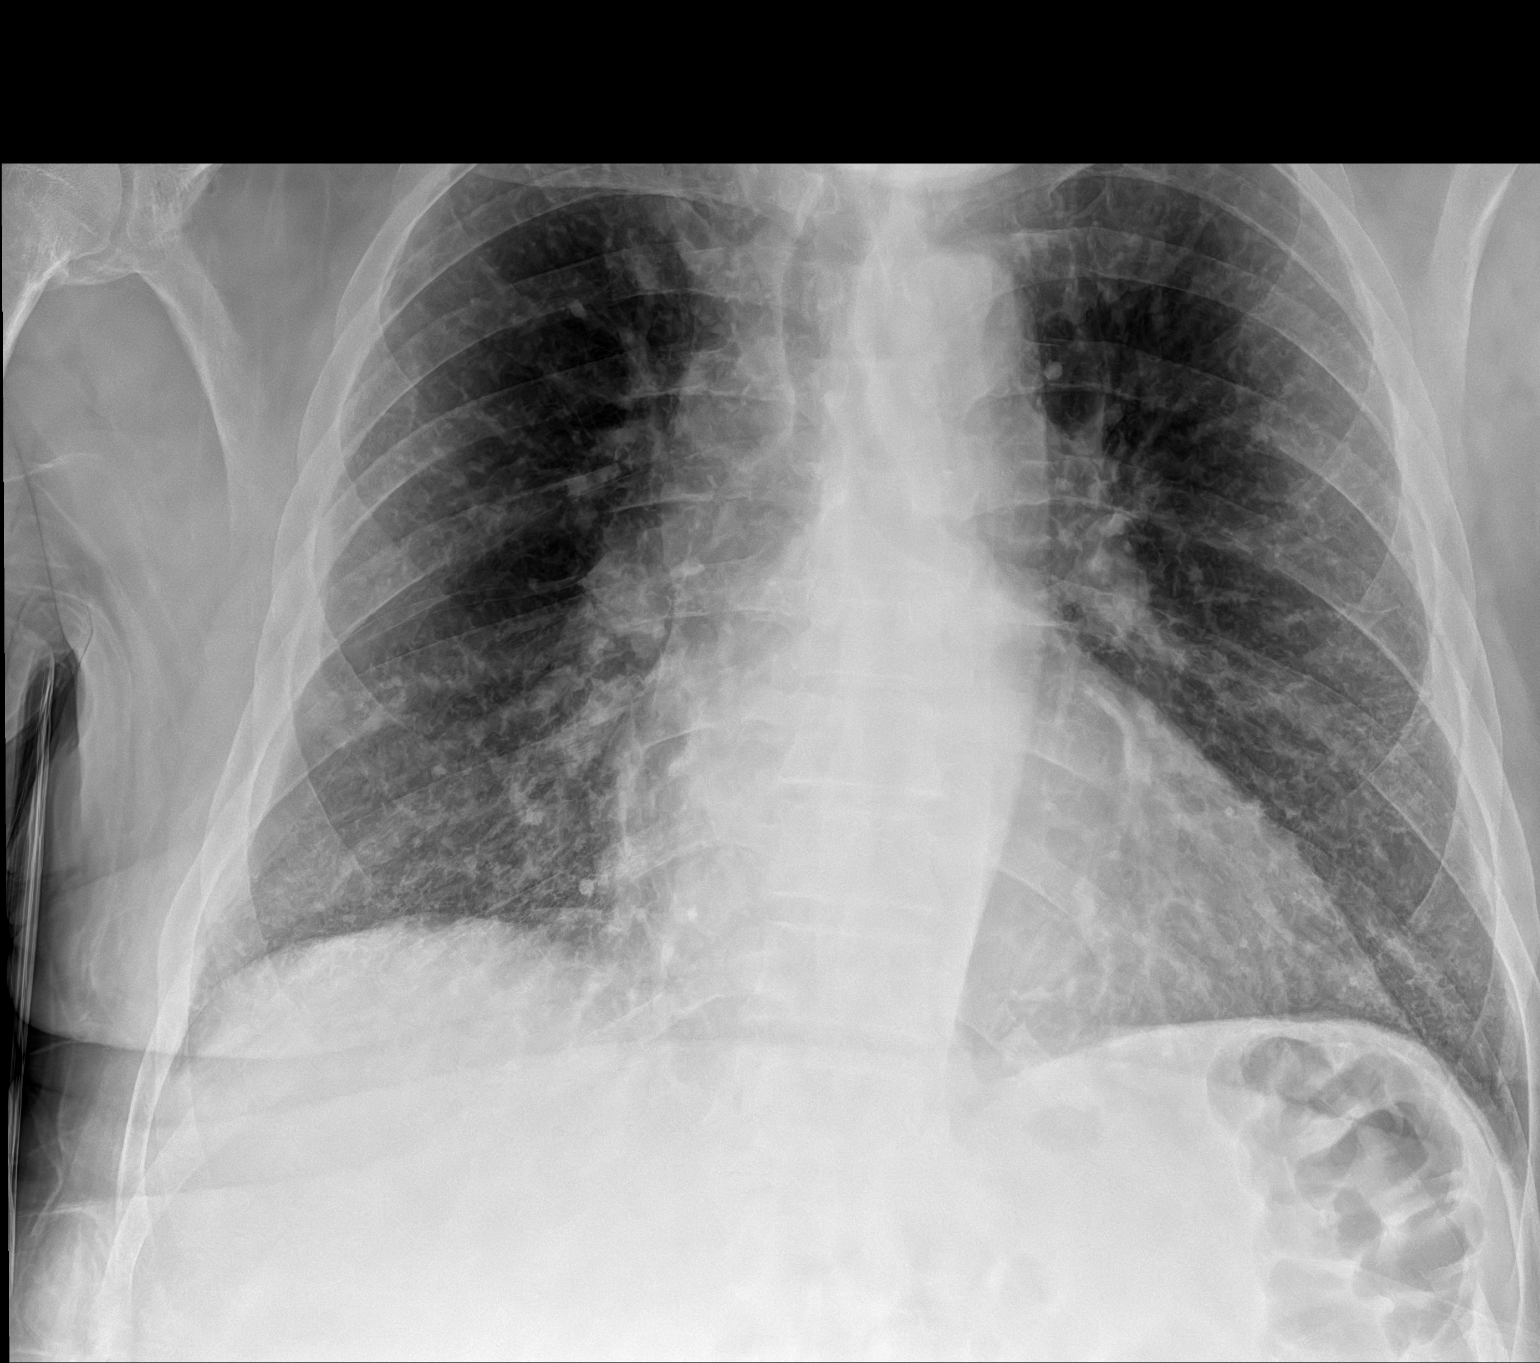

[2 of 2 positions shown; findings below may reference images not displayed]

FINDINGS: Similar cardiomediastinal silhouette. Both lungs are clear. No
visible pleural effusions or pneumothorax. The visualized skeletal
structures are unremarkable.
IMPRESSION: No acute cardiopulmonary disease.
